# Patient Record
Sex: Female | Born: 1955 | Race: White | Hispanic: No | Marital: Married
Health system: Southern US, Community
[De-identification: ages and names within clinical notes are randomized; demographics above are authoritative.]

## PROBLEM LIST (undated history)

## (undated) DIAGNOSIS — M1990 Unspecified osteoarthritis, unspecified site: Secondary | ICD-10-CM

## (undated) DIAGNOSIS — T7840XA Allergy, unspecified, initial encounter: Secondary | ICD-10-CM

## (undated) DIAGNOSIS — F1021 Alcohol dependence, in remission: Secondary | ICD-10-CM

## (undated) DIAGNOSIS — J45909 Unspecified asthma, uncomplicated: Secondary | ICD-10-CM

## (undated) HISTORY — PX: ABDOMINAL HYSTERECTOMY: SHX81

## (undated) HISTORY — DX: Unspecified asthma, uncomplicated: J45.909

## (undated) HISTORY — DX: Allergy, unspecified, initial encounter: T78.40XA

## (undated) HISTORY — DX: Unspecified osteoarthritis, unspecified site: M19.90

## (undated) HISTORY — PX: EYE SURGERY: SHX253

## (undated) HISTORY — DX: Alcohol dependence, in remission: F10.21

---

## 1997-09-03 ENCOUNTER — Ambulatory Visit: Admission: RE | Admit: 1997-09-03 | Discharge: 1997-09-03 | Payer: Self-pay | Admitting: Obstetrics and Gynecology

## 1998-01-25 ENCOUNTER — Other Ambulatory Visit: Admission: RE | Admit: 1998-01-25 | Discharge: 1998-01-25 | Payer: Self-pay | Admitting: Obstetrics and Gynecology

## 1998-09-14 ENCOUNTER — Encounter: Payer: Self-pay | Admitting: Obstetrics and Gynecology

## 1998-09-14 ENCOUNTER — Ambulatory Visit (HOSPITAL_COMMUNITY): Admission: RE | Admit: 1998-09-14 | Discharge: 1998-09-14 | Payer: Self-pay | Admitting: Obstetrics and Gynecology

## 1999-01-28 ENCOUNTER — Other Ambulatory Visit: Admission: RE | Admit: 1999-01-28 | Discharge: 1999-01-28 | Payer: Self-pay | Admitting: Obstetrics and Gynecology

## 1999-09-17 ENCOUNTER — Ambulatory Visit (HOSPITAL_COMMUNITY): Admission: RE | Admit: 1999-09-17 | Discharge: 1999-09-17 | Payer: Self-pay | Admitting: Obstetrics and Gynecology

## 1999-09-17 ENCOUNTER — Encounter: Payer: Self-pay | Admitting: Obstetrics and Gynecology

## 2000-01-31 ENCOUNTER — Other Ambulatory Visit: Admission: RE | Admit: 2000-01-31 | Discharge: 2000-01-31 | Payer: Self-pay | Admitting: Obstetrics and Gynecology

## 2000-02-29 ENCOUNTER — Encounter (INDEPENDENT_AMBULATORY_CARE_PROVIDER_SITE_OTHER): Payer: Self-pay | Admitting: Specialist

## 2000-02-29 ENCOUNTER — Other Ambulatory Visit: Admission: RE | Admit: 2000-02-29 | Discharge: 2000-02-29 | Payer: Self-pay | Admitting: Obstetrics and Gynecology

## 2000-04-05 ENCOUNTER — Observation Stay (HOSPITAL_COMMUNITY): Admission: RE | Admit: 2000-04-05 | Discharge: 2000-04-06 | Payer: Self-pay | Admitting: Obstetrics and Gynecology

## 2000-04-05 ENCOUNTER — Encounter (INDEPENDENT_AMBULATORY_CARE_PROVIDER_SITE_OTHER): Payer: Self-pay

## 2000-09-21 ENCOUNTER — Ambulatory Visit (HOSPITAL_COMMUNITY): Admission: RE | Admit: 2000-09-21 | Discharge: 2000-09-21 | Payer: Self-pay | Admitting: Obstetrics and Gynecology

## 2000-09-21 ENCOUNTER — Encounter: Payer: Self-pay | Admitting: Obstetrics and Gynecology

## 2001-02-27 ENCOUNTER — Other Ambulatory Visit: Admission: RE | Admit: 2001-02-27 | Discharge: 2001-02-27 | Payer: Self-pay | Admitting: Obstetrics and Gynecology

## 2001-04-16 ENCOUNTER — Encounter: Payer: Self-pay | Admitting: Emergency Medicine

## 2001-04-16 ENCOUNTER — Encounter: Admission: RE | Admit: 2001-04-16 | Discharge: 2001-04-16 | Payer: Self-pay | Admitting: Emergency Medicine

## 2001-09-26 ENCOUNTER — Ambulatory Visit (HOSPITAL_COMMUNITY): Admission: RE | Admit: 2001-09-26 | Discharge: 2001-09-26 | Payer: Self-pay | Admitting: Obstetrics and Gynecology

## 2001-09-26 ENCOUNTER — Encounter: Payer: Self-pay | Admitting: Obstetrics and Gynecology

## 2002-06-03 ENCOUNTER — Encounter: Payer: Self-pay | Admitting: Obstetrics and Gynecology

## 2002-06-03 ENCOUNTER — Encounter: Admission: RE | Admit: 2002-06-03 | Discharge: 2002-06-03 | Payer: Self-pay | Admitting: Obstetrics and Gynecology

## 2003-03-11 ENCOUNTER — Other Ambulatory Visit: Admission: RE | Admit: 2003-03-11 | Discharge: 2003-03-11 | Payer: Self-pay | Admitting: Obstetrics and Gynecology

## 2003-06-16 ENCOUNTER — Ambulatory Visit (HOSPITAL_COMMUNITY): Admission: RE | Admit: 2003-06-16 | Discharge: 2003-06-16 | Payer: Self-pay | Admitting: Obstetrics and Gynecology

## 2004-06-18 ENCOUNTER — Encounter: Admission: RE | Admit: 2004-06-18 | Discharge: 2004-06-18 | Payer: Self-pay | Admitting: Obstetrics and Gynecology

## 2005-06-22 ENCOUNTER — Encounter: Admission: RE | Admit: 2005-06-22 | Discharge: 2005-06-22 | Payer: Self-pay | Admitting: Obstetrics and Gynecology

## 2006-07-04 ENCOUNTER — Ambulatory Visit (HOSPITAL_COMMUNITY): Admission: RE | Admit: 2006-07-04 | Discharge: 2006-07-04 | Payer: Self-pay | Admitting: Obstetrics and Gynecology

## 2007-07-06 ENCOUNTER — Ambulatory Visit (HOSPITAL_COMMUNITY): Admission: RE | Admit: 2007-07-06 | Discharge: 2007-07-06 | Payer: Self-pay | Admitting: Obstetrics and Gynecology

## 2008-04-15 DIAGNOSIS — H35419 Lattice degeneration of retina, unspecified eye: Secondary | ICD-10-CM | POA: Insufficient documentation

## 2008-07-08 ENCOUNTER — Ambulatory Visit (HOSPITAL_COMMUNITY): Admission: RE | Admit: 2008-07-08 | Discharge: 2008-07-08 | Payer: Self-pay | Admitting: Obstetrics and Gynecology

## 2009-07-09 ENCOUNTER — Ambulatory Visit (HOSPITAL_COMMUNITY): Admission: RE | Admit: 2009-07-09 | Discharge: 2009-07-09 | Payer: Self-pay | Admitting: Obstetrics and Gynecology

## 2009-12-21 DIAGNOSIS — J4520 Mild intermittent asthma, uncomplicated: Secondary | ICD-10-CM | POA: Insufficient documentation

## 2010-06-07 ENCOUNTER — Other Ambulatory Visit (HOSPITAL_COMMUNITY): Payer: Self-pay | Admitting: Obstetrics and Gynecology

## 2010-06-07 DIAGNOSIS — Z1231 Encounter for screening mammogram for malignant neoplasm of breast: Secondary | ICD-10-CM

## 2010-06-25 NOTE — Op Note (Signed)
[facility]  Patient:    Lisa Contreras, Lisa Contreras                 MRN: [id number] Proc. Date: 04/05/00 Adm. Date:  [id number] Disc. Date: [id number] Attending:  Morene Antu                           Operative Report  PREOPERATIVE DIAGNOSIS:       Fibroid uterus.  POSTOPERATIVE DIAGNOSIS:      Fibroid uterus.  PROCEDURE:                    Laparoscopic-assisted vaginal hysterectomy, left                               salpingo-oophorectomy.  SURGEON:                      Sherry A. Rosalio Macadamia, M.D., Marina Gravel, M.D.  ANESTHESIA:                   General.  INDICATIONS:                  This is a 55 year old, G3, p1-0-2-1 woman, who has had a known fibroid uterus for many years.  The fibroids have just been monitored with only slow increase in size of the fibroids.  However, this year at the patients routine exam in December, the patient complained of heavier menstrual flow, more menstrual cramps, and severe pelvic pressure.  Because of these symptoms, the patient requests surgical removal of her uterus. Ultrasound had been performed at that time, showing significant increased size of her fibroid.  Fibroid at this time was approximately 7-8 cm in size. Because of this, the patient is brought to the operating room for an LAVH. Ovaries will remain in place if at all possible.  FINDINGS:                     12 weeks size anteflexed uterus, normal fallopian tubes and ovaries, normal upper abdominal exploration.  DESCRIPTION OF PROCEDURE:     The patient was brought into the operating room and given general anesthesia.  She was placed in the dorsal lithotomy position.  Her abdomen and her vagina were washed with Betadine.  Foley catheter was placed in the bladder.  Under buttocks drape was placed. Speculum was placed within the vagina.  Anterior lip of the cervix was grasped with a single-tooth tenaculum.  Single-tooth Hulka tenaculum was placed  in the endocervical canal.  First tenaculum and speculum were removed.  Surgeons gown and gloves were changed.  The patient was fully draped in a sterile fashion.  Subumbilical area was infiltrated with 0.25% Marcaine.  Incision was made.  Veress needle was introduced into the peritoneal cavity.  Its placement was checked with saline. Approximately 3 liters of carbon dioxide was insufflated.  Veress needle was removed, and laparoscopic trocar was easily introduced into the peritoneal cavity.  Laparoscope was placed with positive identification of pelvic organs. Left mid abdominal incision was made after placing Marcaine, and trocar was placed.  Under direct visualization, the same procedure was performed on the right.  The pelvis was inspected.  Although the uterus seemed to be somewhat enlarged with this fibroid, it was felt that an LAVH could be performed.  The left ovary was draped across part  of the uterus and fibroid, whereas the right tube and ovary were completely free.  Using 5 mm Ligasure instrument, the round ligament was cauterized and then cut.  The tissues from the round ligament to the left fallopian tube were cauterized in a similar fashion and cut.  The uteroovarian ligament was attempted to be separated from the uterus. However, this was in such proximity to the uterus, that it was very difficult.  The anatomy is such that it could not be separated safely. Therefore, the decision was made to do a left salpingo-oophorectomy.  The left infundibulopelvic ligament was cauterized and cut in multiple bites.  The ureter had been identified well below this prior to any cautery or surgical procedure.  Once this was able to get below the ovary and fibroid, the tissues were cauterized to the edge of the fibroid just above the uterine arteries. Attention was then turned to the right side.  The right uteroovarian ligament was able to be cauterized in multiple burns and cut.  The right  round ligament was cauterized and cut in a similar fashion.  The right tube and ovary was able to drop down into the pelvis.  There were some adhesions of the right ovary to the pelvic sidewall, and these were ligated.  The right ureter had been well identified below the surgical plane prior to any cautery.  The cautery was continued right to the sidewall of the uterus, again above the uterine arteries.  At this point, attention was turned to the vaginal portion of the case.  The patients legs were elevated.  A weighted speculum was placed within the vagina.  The cervix was grasped with two Christella Hartigan tenaculums.  The cervix was infiltrated with 1% Xylocaine and with 1:200,000 epinephrine circumferentially.  The cervix was circumcised circumferentially.  The bladder was developed off of the cervix with blunt dissection.  The posterior cul-de-sac was identified and entered sharply.  Some of these tissues were then cauterized with a Ligasure.  The uterosacral ligaments were identified. They were clamped, cut, and suture ligated with 0 Vicryl ligatures.  The cardinal ligaments were clamped with Ligasure cautery x 2 and then cut.  This was performed on alternating sides.  The bladder peritoneum was entered above the cervix.  The cardinal ligaments were continued to be clamped, cauterized x 2 and cut.  It was felt that the tissues on the right side of the uterus had been completely severed.  The cervix and a portion of the uterus was dissected in the midline for removal.  Another large portion of fibroid tissue was removed with sharp dissection in the middle of the uterus.  The uterus was then able to be manipulated to be removed from the vagina.  There was a small remaining ridge of tissue between the cardinal ligament and the left side of the uterus.  This was cauterized with the Ligasure x 2 and cut.  The posterior vaginal cuff was then closed using #0 Vicryl in a running, lock stitch  for hemostasis.  There was some oozing along the pedicles of the cardinal ligament.  This was closed using the 0 Vicryl figure-of-eight sutures stitches.  There was some bleeding beneath the bladder.  This was lightly  cauterized.  The vaginal cuff was closed anteriorly by doing a running lock stitch with 0 Vicryl, incorporating the peritoneal tissue to the vaginal edge. Adequate hemostasis was then present with no further significant bleeding present.  The vaginal cuff was then closed using 0  Vicryl in mattress-type sutures, making a vertical closure.  Adequate hemostasis was present.  The surgeons gloves were changed.  The carbon dioxide was once again allowed to insufflate the peritoneal cavity, and the laparoscope was replaced in the abdominal cavity.  Pictures were obtained of all pedicles.  The pedicles were irrigated.  There as no active bleeding.  All irrigant and bloody fluid was suctioned.  Upper abdominal view was taken, and there were no significant abnormalities seen.  After most of the bloody fluid was able to be removed, the laparoscope was removed under direct visualization.  The trocars were removed after the left was removed under direct visualization.  The right trocar was removed after all carbon dioxide had escaped after the laparoscope had been removed.  The umbilical incision was closed with 0 Vicryl in a figure-of-eight stitch.  Incisions were washed, and adequate hemostasis was present.  Incisions were closed with Dermabond.  The patient was then taken out of the dorsal lithotomy position.  She was awakened.  She was extubated. She was moved from the operating table to a stretcher in stable condition. Complications were none.  Estimated blood loss was 250 cc. DD:  04/05/00 TD:  04/06/00 Job: [id number]

## 2010-07-12 ENCOUNTER — Ambulatory Visit (HOSPITAL_COMMUNITY)
Admission: RE | Admit: 2010-07-12 | Discharge: 2010-07-12 | Disposition: A | Discharge status: Home / Self Care | Payer: BC Managed Care – PPO | Source: Ambulatory Visit | Attending: Obstetrics and Gynecology | Admitting: Obstetrics and Gynecology

## 2010-07-12 DIAGNOSIS — Z1231 Encounter for screening mammogram for malignant neoplasm of breast: Secondary | ICD-10-CM | POA: Insufficient documentation

## 2011-05-03 DIAGNOSIS — J309 Allergic rhinitis, unspecified: Secondary | ICD-10-CM | POA: Insufficient documentation

## 2011-06-07 ENCOUNTER — Other Ambulatory Visit (HOSPITAL_COMMUNITY): Payer: Self-pay | Admitting: Obstetrics and Gynecology

## 2011-06-07 DIAGNOSIS — Z1231 Encounter for screening mammogram for malignant neoplasm of breast: Secondary | ICD-10-CM

## 2011-07-14 ENCOUNTER — Ambulatory Visit (HOSPITAL_COMMUNITY)
Admission: RE | Admit: 2011-07-14 | Discharge: 2011-07-14 | Disposition: A | Discharge status: Home / Self Care | Payer: PRIVATE HEALTH INSURANCE | Source: Ambulatory Visit | Attending: Obstetrics and Gynecology | Admitting: Obstetrics and Gynecology

## 2011-07-14 DIAGNOSIS — Z1231 Encounter for screening mammogram for malignant neoplasm of breast: Secondary | ICD-10-CM | POA: Insufficient documentation

## 2012-05-03 ENCOUNTER — Other Ambulatory Visit (HOSPITAL_COMMUNITY): Payer: Self-pay | Admitting: Family Medicine

## 2012-05-03 DIAGNOSIS — B3732 Chronic candidiasis of vulva and vagina: Secondary | ICD-10-CM | POA: Insufficient documentation

## 2012-05-03 DIAGNOSIS — Z1231 Encounter for screening mammogram for malignant neoplasm of breast: Secondary | ICD-10-CM

## 2012-07-16 ENCOUNTER — Ambulatory Visit (HOSPITAL_COMMUNITY)
Admission: RE | Admit: 2012-07-16 | Discharge: 2012-07-16 | Disposition: A | Discharge status: Home / Self Care | Payer: PRIVATE HEALTH INSURANCE | Source: Ambulatory Visit | Attending: Family Medicine | Admitting: Family Medicine

## 2012-07-16 DIAGNOSIS — Z1231 Encounter for screening mammogram for malignant neoplasm of breast: Secondary | ICD-10-CM

## 2013-05-06 DIAGNOSIS — N393 Stress incontinence (female) (male): Secondary | ICD-10-CM | POA: Insufficient documentation

## 2013-06-11 ENCOUNTER — Other Ambulatory Visit (HOSPITAL_COMMUNITY): Payer: Self-pay | Admitting: Family Medicine

## 2013-06-11 DIAGNOSIS — Z1231 Encounter for screening mammogram for malignant neoplasm of breast: Secondary | ICD-10-CM

## 2013-07-23 ENCOUNTER — Ambulatory Visit (HOSPITAL_COMMUNITY)
Admission: RE | Admit: 2013-07-23 | Discharge: 2013-07-23 | Disposition: A | Discharge status: Home / Self Care | Payer: Managed Care, Other (non HMO) | Source: Ambulatory Visit | Attending: Family Medicine | Admitting: Family Medicine

## 2013-07-23 DIAGNOSIS — Z1231 Encounter for screening mammogram for malignant neoplasm of breast: Secondary | ICD-10-CM | POA: Insufficient documentation

## 2013-08-20 ENCOUNTER — Ambulatory Visit (HOSPITAL_COMMUNITY): Payer: PRIVATE HEALTH INSURANCE

## 2014-06-16 ENCOUNTER — Other Ambulatory Visit (HOSPITAL_COMMUNITY): Payer: Self-pay | Admitting: Family Medicine

## 2014-06-16 DIAGNOSIS — Z1231 Encounter for screening mammogram for malignant neoplasm of breast: Secondary | ICD-10-CM

## 2014-07-25 ENCOUNTER — Ambulatory Visit (HOSPITAL_COMMUNITY)
Admission: RE | Admit: 2014-07-25 | Discharge: 2014-07-25 | Disposition: A | Discharge status: Home / Self Care | Payer: BLUE CROSS/BLUE SHIELD | Source: Ambulatory Visit | Attending: Family Medicine | Admitting: Family Medicine

## 2014-07-25 DIAGNOSIS — Z1231 Encounter for screening mammogram for malignant neoplasm of breast: Secondary | ICD-10-CM | POA: Diagnosis not present

## 2014-09-03 DIAGNOSIS — F1021 Alcohol dependence, in remission: Secondary | ICD-10-CM | POA: Insufficient documentation

## 2015-06-29 ENCOUNTER — Other Ambulatory Visit: Payer: Self-pay

## 2015-06-29 DIAGNOSIS — Z1231 Encounter for screening mammogram for malignant neoplasm of breast: Secondary | ICD-10-CM

## 2015-08-03 ENCOUNTER — Ambulatory Visit
Admission: RE | Admit: 2015-08-03 | Discharge: 2015-08-03 | Disposition: A | Discharge status: Home / Self Care | Payer: 59 | Source: Ambulatory Visit

## 2015-08-03 DIAGNOSIS — Z1231 Encounter for screening mammogram for malignant neoplasm of breast: Secondary | ICD-10-CM

## 2016-01-13 DIAGNOSIS — M79673 Pain in unspecified foot: Secondary | ICD-10-CM

## 2016-01-28 DIAGNOSIS — R52 Pain, unspecified: Secondary | ICD-10-CM

## 2016-03-02 DIAGNOSIS — L738 Other specified follicular disorders: Secondary | ICD-10-CM | POA: Diagnosis not present

## 2016-03-02 DIAGNOSIS — L821 Other seborrheic keratosis: Secondary | ICD-10-CM | POA: Diagnosis not present

## 2016-03-02 DIAGNOSIS — L720 Epidermal cyst: Secondary | ICD-10-CM | POA: Diagnosis not present

## 2016-05-17 DIAGNOSIS — Z1159 Encounter for screening for other viral diseases: Secondary | ICD-10-CM | POA: Diagnosis not present

## 2016-05-17 DIAGNOSIS — J4520 Mild intermittent asthma, uncomplicated: Secondary | ICD-10-CM | POA: Diagnosis not present

## 2016-05-17 DIAGNOSIS — Z0000 Encounter for general adult medical examination without abnormal findings: Secondary | ICD-10-CM | POA: Diagnosis not present

## 2016-05-17 LAB — HM HEPATITIS C SCREENING LAB: HM Hepatitis Screen: NEGATIVE

## 2016-06-24 ENCOUNTER — Other Ambulatory Visit: Payer: Self-pay | Admitting: Family Medicine

## 2016-06-24 DIAGNOSIS — Z1231 Encounter for screening mammogram for malignant neoplasm of breast: Secondary | ICD-10-CM

## 2016-08-04 ENCOUNTER — Ambulatory Visit
Admission: RE | Admit: 2016-08-04 | Discharge: 2016-08-04 | Disposition: A | Discharge status: Home / Self Care | Payer: 59 | Source: Ambulatory Visit | Attending: Family Medicine | Admitting: Family Medicine

## 2016-08-04 DIAGNOSIS — Z1231 Encounter for screening mammogram for malignant neoplasm of breast: Secondary | ICD-10-CM

## 2016-08-07 LAB — HM COLONOSCOPY

## 2017-02-21 DIAGNOSIS — M19042 Primary osteoarthritis, left hand: Secondary | ICD-10-CM | POA: Diagnosis not present

## 2017-02-21 DIAGNOSIS — N761 Subacute and chronic vaginitis: Secondary | ICD-10-CM | POA: Diagnosis not present

## 2017-02-21 DIAGNOSIS — J309 Allergic rhinitis, unspecified: Secondary | ICD-10-CM | POA: Diagnosis not present

## 2017-03-06 DIAGNOSIS — Z118 Encounter for screening for other infectious and parasitic diseases: Secondary | ICD-10-CM | POA: Diagnosis not present

## 2017-03-06 DIAGNOSIS — M19041 Primary osteoarthritis, right hand: Secondary | ICD-10-CM | POA: Diagnosis not present

## 2017-03-06 DIAGNOSIS — Z01419 Encounter for gynecological examination (general) (routine) without abnormal findings: Secondary | ICD-10-CM | POA: Diagnosis not present

## 2017-03-06 DIAGNOSIS — N761 Subacute and chronic vaginitis: Secondary | ICD-10-CM | POA: Diagnosis not present

## 2017-03-13 ENCOUNTER — Ambulatory Visit
Admission: RE | Admit: 2017-03-13 | Discharge: 2017-03-13 | Disposition: A | Discharge status: Home / Self Care | Payer: 59 | Source: Ambulatory Visit | Attending: Family Medicine | Admitting: Family Medicine

## 2017-03-13 ENCOUNTER — Other Ambulatory Visit: Payer: Self-pay | Admitting: Family Medicine

## 2017-03-13 DIAGNOSIS — R609 Edema, unspecified: Secondary | ICD-10-CM

## 2017-03-13 DIAGNOSIS — M19041 Primary osteoarthritis, right hand: Secondary | ICD-10-CM | POA: Diagnosis not present

## 2017-03-20 DIAGNOSIS — M19049 Primary osteoarthritis, unspecified hand: Secondary | ICD-10-CM | POA: Diagnosis not present

## 2017-03-20 DIAGNOSIS — N952 Postmenopausal atrophic vaginitis: Secondary | ICD-10-CM | POA: Diagnosis not present

## 2017-05-03 DIAGNOSIS — L720 Epidermal cyst: Secondary | ICD-10-CM | POA: Diagnosis not present

## 2017-05-03 DIAGNOSIS — L738 Other specified follicular disorders: Secondary | ICD-10-CM | POA: Diagnosis not present

## 2017-05-03 DIAGNOSIS — L820 Inflamed seborrheic keratosis: Secondary | ICD-10-CM | POA: Diagnosis not present

## 2017-05-03 DIAGNOSIS — L821 Other seborrheic keratosis: Secondary | ICD-10-CM | POA: Diagnosis not present

## 2017-05-17 DIAGNOSIS — Z0000 Encounter for general adult medical examination without abnormal findings: Secondary | ICD-10-CM | POA: Diagnosis not present

## 2017-05-22 DIAGNOSIS — E875 Hyperkalemia: Secondary | ICD-10-CM | POA: Diagnosis not present

## 2017-05-22 DIAGNOSIS — N952 Postmenopausal atrophic vaginitis: Secondary | ICD-10-CM | POA: Diagnosis not present

## 2017-05-22 DIAGNOSIS — Z0000 Encounter for general adult medical examination without abnormal findings: Secondary | ICD-10-CM | POA: Diagnosis not present

## 2017-05-22 DIAGNOSIS — Z1211 Encounter for screening for malignant neoplasm of colon: Secondary | ICD-10-CM | POA: Diagnosis not present

## 2017-06-12 DIAGNOSIS — E875 Hyperkalemia: Secondary | ICD-10-CM | POA: Diagnosis not present

## 2017-06-15 DIAGNOSIS — E875 Hyperkalemia: Secondary | ICD-10-CM | POA: Diagnosis not present

## 2017-06-26 ENCOUNTER — Other Ambulatory Visit: Payer: Self-pay | Admitting: Family Medicine

## 2017-06-26 DIAGNOSIS — Z1231 Encounter for screening mammogram for malignant neoplasm of breast: Secondary | ICD-10-CM

## 2017-08-07 ENCOUNTER — Ambulatory Visit
Admission: RE | Admit: 2017-08-07 | Discharge: 2017-08-07 | Disposition: A | Discharge status: Home / Self Care | Payer: 59 | Source: Ambulatory Visit | Attending: Family Medicine | Admitting: Family Medicine

## 2017-08-07 DIAGNOSIS — Z1231 Encounter for screening mammogram for malignant neoplasm of breast: Secondary | ICD-10-CM | POA: Diagnosis not present

## 2017-09-07 DIAGNOSIS — R0989 Other specified symptoms and signs involving the circulatory and respiratory systems: Secondary | ICD-10-CM | POA: Diagnosis not present

## 2017-09-07 DIAGNOSIS — Z711 Person with feared health complaint in whom no diagnosis is made: Secondary | ICD-10-CM | POA: Diagnosis not present

## 2017-09-14 DIAGNOSIS — Z711 Person with feared health complaint in whom no diagnosis is made: Secondary | ICD-10-CM | POA: Diagnosis not present

## 2017-09-25 DIAGNOSIS — Z23 Encounter for immunization: Secondary | ICD-10-CM | POA: Diagnosis not present

## 2017-11-09 DIAGNOSIS — E875 Hyperkalemia: Secondary | ICD-10-CM | POA: Diagnosis not present

## 2017-11-14 DIAGNOSIS — E875 Hyperkalemia: Secondary | ICD-10-CM | POA: Diagnosis not present

## 2018-01-09 DIAGNOSIS — Z6826 Body mass index (BMI) 26.0-26.9, adult: Secondary | ICD-10-CM | POA: Diagnosis not present

## 2018-01-09 DIAGNOSIS — J069 Acute upper respiratory infection, unspecified: Secondary | ICD-10-CM | POA: Diagnosis not present

## 2018-05-29 DIAGNOSIS — J45909 Unspecified asthma, uncomplicated: Secondary | ICD-10-CM | POA: Diagnosis not present

## 2018-05-29 DIAGNOSIS — N952 Postmenopausal atrophic vaginitis: Secondary | ICD-10-CM | POA: Diagnosis not present

## 2018-05-29 DIAGNOSIS — J309 Allergic rhinitis, unspecified: Secondary | ICD-10-CM | POA: Diagnosis not present

## 2018-06-12 DIAGNOSIS — M79676 Pain in unspecified toe(s): Secondary | ICD-10-CM

## 2018-06-12 DIAGNOSIS — D1801 Hemangioma of skin and subcutaneous tissue: Secondary | ICD-10-CM | POA: Diagnosis not present

## 2018-06-12 DIAGNOSIS — L820 Inflamed seborrheic keratosis: Secondary | ICD-10-CM | POA: Diagnosis not present

## 2018-06-12 DIAGNOSIS — L720 Epidermal cyst: Secondary | ICD-10-CM | POA: Diagnosis not present

## 2018-06-29 ENCOUNTER — Other Ambulatory Visit: Payer: Self-pay | Admitting: Family Medicine

## 2018-06-29 DIAGNOSIS — Z1231 Encounter for screening mammogram for malignant neoplasm of breast: Secondary | ICD-10-CM

## 2018-08-17 ENCOUNTER — Ambulatory Visit
Admission: RE | Admit: 2018-08-17 | Discharge: 2018-08-17 | Disposition: A | Discharge status: Home / Self Care | Payer: 59 | Source: Ambulatory Visit | Attending: Family Medicine | Admitting: Family Medicine

## 2018-08-17 ENCOUNTER — Other Ambulatory Visit: Payer: Self-pay

## 2018-08-17 DIAGNOSIS — Z1231 Encounter for screening mammogram for malignant neoplasm of breast: Secondary | ICD-10-CM

## 2018-09-25 ENCOUNTER — Other Ambulatory Visit: Payer: Self-pay

## 2018-09-25 DIAGNOSIS — Z20822 Contact with and (suspected) exposure to covid-19: Secondary | ICD-10-CM

## 2018-09-26 LAB — NOVEL CORONAVIRUS, NAA: SARS-CoV-2, NAA: NOT DETECTED

## 2018-12-20 IMAGING — DX DG FINGER RING 2+V*R*
3 series · 3 of 3 positions shown · non-contrast
Comparison: None.

CLINICAL DATA: Right ring finger pain and swelling centered about
the PIP joint for 1 month. No known injury.

EXAM:
RIGHT RING FINGER 2+V

[dg finger ring right (1 of 3)]
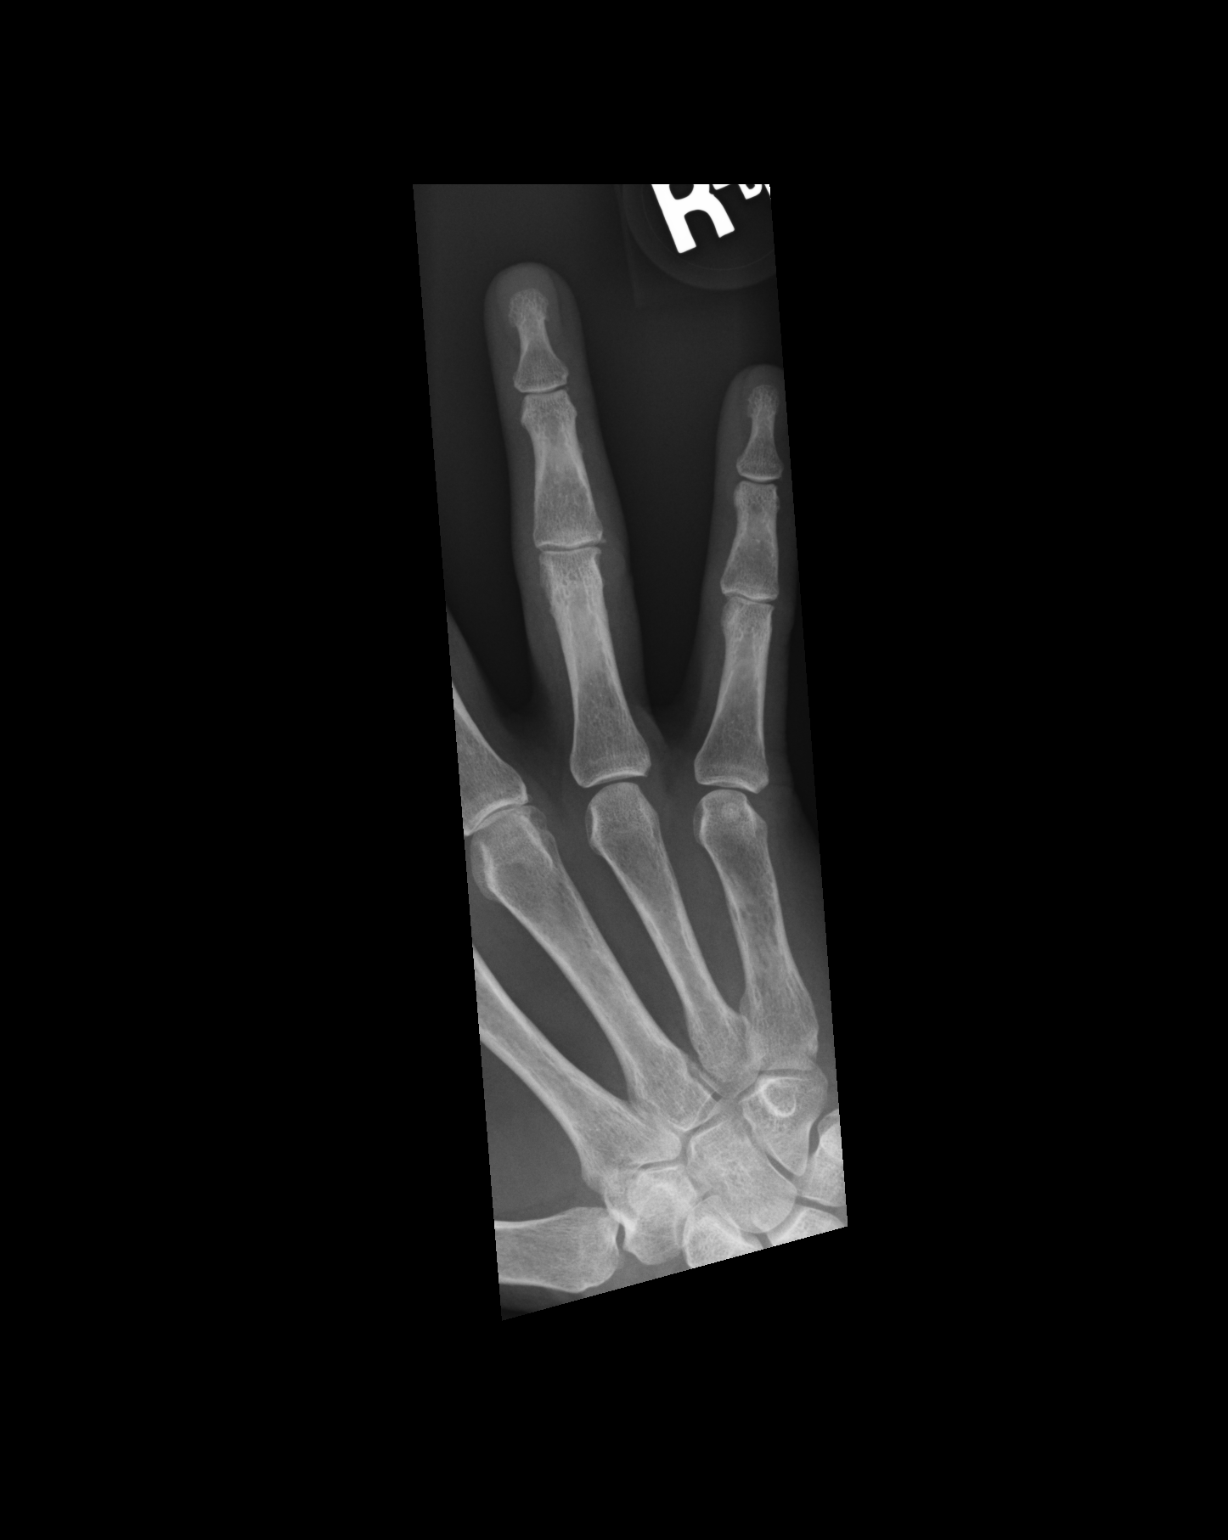

[dg finger ring right (2 of 3)]
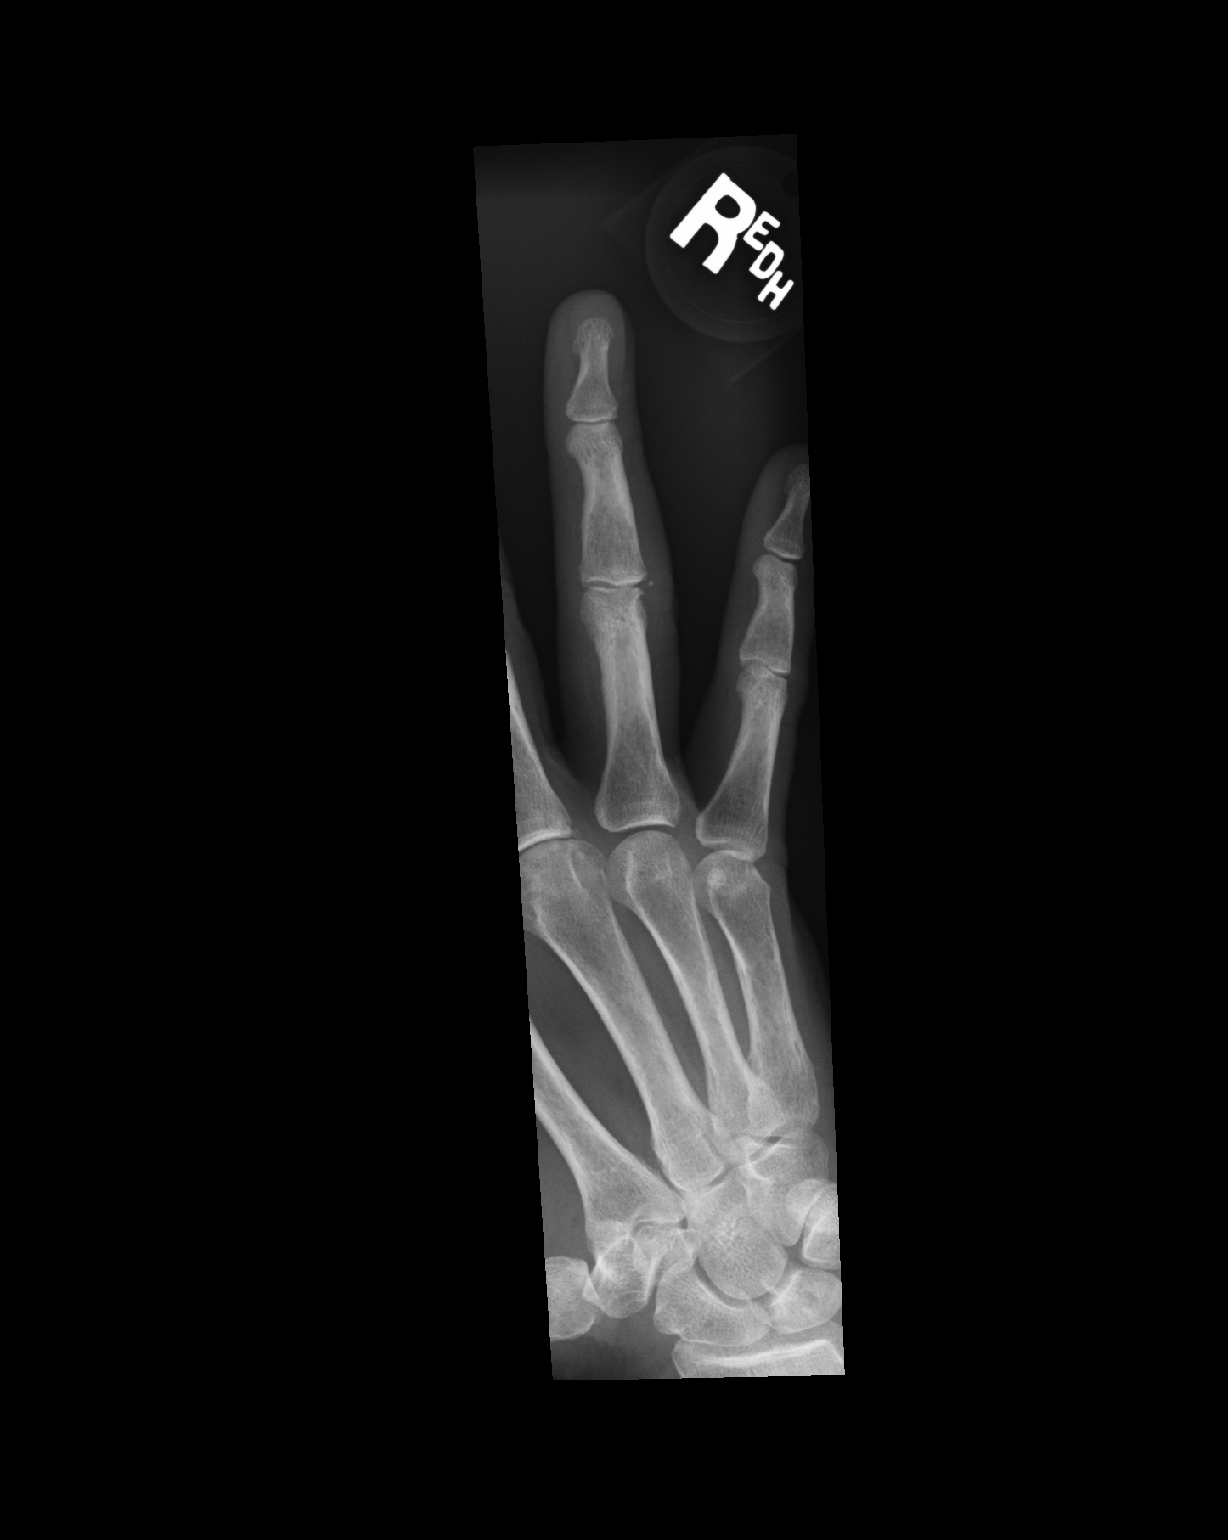

[dg finger ring right (3 of 3)]
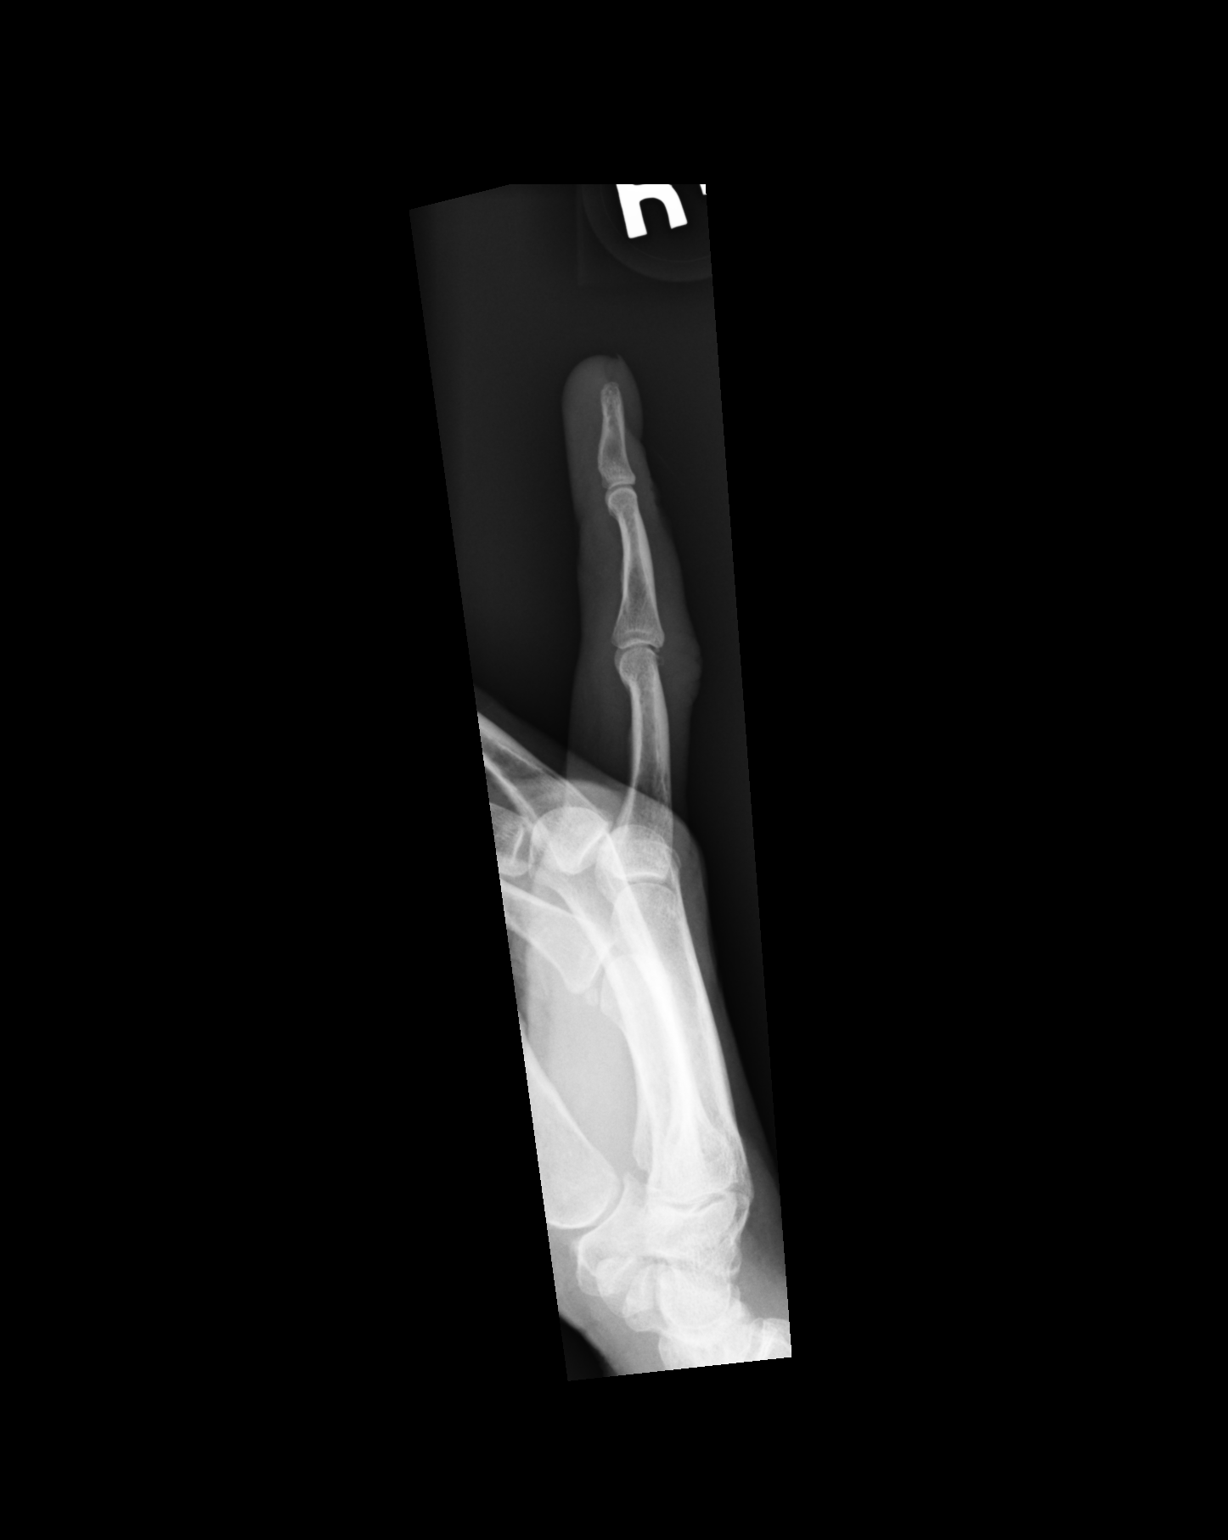

[3 of 3 positions shown; findings below may reference images not displayed]

FINDINGS: No acute bony or joint abnormality is identified. There is some
osteophytosis about the PIP joint of the ring finger. Soft tissues
about the PIP joint appear swollen. No soft tissue gas or radiopaque
foreign body.
IMPRESSION: Soft tissue swelling PIP joint right ring finger without underlying
acute bony or joint abnormality.

Mild to moderate osteoarthritis PIP joint right ring finger.

## 2019-07-12 ENCOUNTER — Other Ambulatory Visit: Payer: Self-pay | Admitting: Family Medicine

## 2019-07-12 DIAGNOSIS — Z1231 Encounter for screening mammogram for malignant neoplasm of breast: Secondary | ICD-10-CM

## 2019-08-19 ENCOUNTER — Other Ambulatory Visit: Payer: Self-pay

## 2019-08-19 ENCOUNTER — Ambulatory Visit
Admission: RE | Admit: 2019-08-19 | Discharge: 2019-08-19 | Disposition: A | Discharge status: Home / Self Care | Payer: Medicare Other | Source: Ambulatory Visit | Attending: Family Medicine | Admitting: Family Medicine

## 2019-08-19 DIAGNOSIS — Z1231 Encounter for screening mammogram for malignant neoplasm of breast: Secondary | ICD-10-CM

## 2020-01-06 ENCOUNTER — Ambulatory Visit (INDEPENDENT_AMBULATORY_CARE_PROVIDER_SITE_OTHER): Payer: Medicare Other | Admitting: Podiatrist

## 2020-01-06 ENCOUNTER — Encounter: Payer: Self-pay | Admitting: Podiatrist

## 2020-01-06 ENCOUNTER — Other Ambulatory Visit: Payer: Self-pay

## 2020-01-06 DIAGNOSIS — L84 Corns and callosities: Secondary | ICD-10-CM

## 2020-01-06 DIAGNOSIS — M722 Plantar fascial fibromatosis: Secondary | ICD-10-CM

## 2020-01-06 DIAGNOSIS — M2042 Other hammer toe(s) (acquired), left foot: Secondary | ICD-10-CM | POA: Diagnosis not present

## 2020-01-06 NOTE — Progress Notes (Signed)
  Chief Complaint  Patient presents with  . Callouses    PT STATES SHE HAS A PRESENT CALLUS   . Foot Orthotics    PT STATES SHE WANTS HER ORTHOTICS LOOKED AT FOR FURTHER EVALUATION      HPI: Patient is 64 y.o. female who presents today for the concerns as listed above. She relates she used to see Dr. Amalia Hailey for plantar fasciitis and has orthotics from that time period.  She would like to know if she needs new ones or just a refurbishment of the current orthotics.  She also has a callus on the outside of the left fourth toe that has become bothersome.  She has tried no treatment for the callus.   There are no problems to display for this patient.   No current outpatient medications on file prior to visit.   No current facility-administered medications on file prior to visit.    No Known Allergies  Review of Systems No fevers, chills, nausea, muscle aches, no difficulty breathing, no calf pain, no chest pain or shortness of breath.   Physical Exam  GENERAL APPEARANCE: Alert, conversant. Appropriately groomed. No acute distress.   VASCULAR: Pedal pulses palpable DP and PT bilateral.  Capillary refill time is immediate to all digits,  Proximal to distal cooling it warm to warm.  Digital perfusion adequate.   NEUROLOGIC: sensation is intact to 5.07 monofilament at 5/5 sites bilateral.  Light touch is intact bilateral, vibratory sensation intact bilateral  MUSCULOSKELETAL: acceptable muscle strength, tone and stability bilateral.  No gross boney pedal deformities noted. 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   DERMATOLOGIC: skin is warm, supple, and dry.  No open lesions noted.  No rash, small callus left fourth toe laterally. Digital nails are asymptomatic.      Assessment     ICD-10-CM   1. Hammertoe of left foot  M20.42   2. Corn or callus  L84   3. Plantar fasciitis, bilateral  M72.2       Plan  Examination findings discussed with the patient.  I recommended paring down the callus on the left fourth toe and the patient agreed I  pared the callus down with a #15 blade without complication and offered a silicone pad.  I recommended lambswool to try as well. I  also evaluated her orthotics and recommended a new pair.  Liliane Channel was able to get her scanned at today's visit and he will call her when these are ready for pickup.  She will then be seen back as needed for follow-up if any concerns arise she will call.

## 2020-02-13 ENCOUNTER — Other Ambulatory Visit: Payer: Medicare Other | Admitting: Orthotics

## 2020-02-13 ENCOUNTER — Other Ambulatory Visit: Payer: Self-pay

## 2020-02-17 ENCOUNTER — Other Ambulatory Visit: Payer: Medicare Other | Admitting: Orthotics

## 2020-03-16 ENCOUNTER — Telehealth: Payer: Self-pay | Admitting: Podiatrist

## 2020-03-16 NOTE — Telephone Encounter (Signed)
Pt left message this morning asking about status of orthotics.  Upon checking with Liliane Channel they should be done today and I told pt I will have him bring them to me when done and I will call to let her know.Marland Kitchen

## 2020-04-01 ENCOUNTER — Encounter: Payer: Self-pay | Admitting: Podiatrist

## 2020-04-01 ENCOUNTER — Other Ambulatory Visit: Payer: Self-pay

## 2020-04-01 ENCOUNTER — Ambulatory Visit (INDEPENDENT_AMBULATORY_CARE_PROVIDER_SITE_OTHER): Payer: Medicare Other | Admitting: Podiatrist

## 2020-04-01 DIAGNOSIS — M722 Plantar fascial fibromatosis: Secondary | ICD-10-CM

## 2020-04-01 MED ORDER — TRIAMCINOLONE ACETONIDE 10 MG/ML IJ SUSP
10.0000 mg | Freq: Once | INTRAMUSCULAR | Status: AC
  Administered 2020-04-01: 10 mg

## 2020-04-01 NOTE — Patient Instructions (Signed)

## 2020-04-01 NOTE — Progress Notes (Signed)
Chief Complaint  Patient presents with  . Plantar Fasciitis    F/U BL PF -pt picked up new inserts today -pain comes and goes; 10/10 at LT and 3/10 at Rt (Lt>Rt) -w/ swelling Tx: splints, icing, stretching      HPI: Patient is 65 y.o. female who presents today for pain on bilateral heels consistent with plantar fasciitis.  She has had plantar fasciitis for several years which have been under control with the use of orthotics.  Recently the pain has returned and she would like an injection.  She used to see Dr. Amalia Hailey for plantar fasciitis in the past.     Allergies  Allergen Reactions  . Aspirin Anaphylaxis, Hives, Itching, Shortness Of Breath and Other (See Comments)  . Elemental Sulfur Anaphylaxis, Hives, Itching, Rash, Shortness Of Breath and Swelling  . Penicillins Anaphylaxis, Hives, Itching, Nausea And Vomiting, Rash, Shortness Of Breath and Other (See Comments)  . Sulfa Antibiotics Anaphylaxis  . Tinidazole Other (See Comments)  . Chocolate Other (See Comments)  . Codeine Itching and Other (See Comments)  . Other Other (See Comments)  . Vitamin C Other (See Comments)  . Pseudoephedrine-Codeine-Gg Hives, Itching and Palpitations    Review of systems is reviewed and negative.   Physical Exam   GENERAL APPEARANCE: Alert, conversant. Appropriately groomed. No acute distress.   VASCULAR: Pedal pulses palpable and strong bilateral.  Capillary refill time is immediate to all digits,  Proximal to distal cooling it warm to warm.  Digital hair growth is present bilateral   NEUROLOGIC: sensation is intact epicritically and protectively to 5.07 monofilament at 5/5 sites bilateral.  Light touch is intact bilateral, vibratory sensation intact bilateral, achilles tendon reflex is intact bilateral.  Negative Tinel sign is elicited  MUSCULOSKELETAL: Pain on palpation plantar medial aspect both feet  at insertion of plantar fascia on the medial calcaneal tubercle. Inflammation at the insertion  of the plantar fascia is present. Rectus foot type is seen.  DERMATOLOGIC: skin color, texture, and turger are within normal limits.  No preulcerative lesions are seen, no interdigital maceration noted.  No open lesions present.  Digital nails are asymptomatic.    Assessment:   ICD-10-CM   1. Plantar fasciitis, bilateral  M72.2      Plan: Discussed treatment options and at this time a plantar fascial injection was recommended.  The patient agreed and a sterile skin prep was applied.  An injection consisting of kenalog and marcaine mixture was infiltrated at the point of maximal tenderness on the bilateral Heel.  The patient tolerated this well and was given instructions for aftercare.

## 2020-04-10 ENCOUNTER — Other Ambulatory Visit: Payer: Self-pay

## 2020-04-10 ENCOUNTER — Encounter: Payer: Self-pay | Admitting: Podiatry

## 2020-04-10 ENCOUNTER — Ambulatory Visit (INDEPENDENT_AMBULATORY_CARE_PROVIDER_SITE_OTHER): Payer: Medicare Other | Admitting: Podiatry

## 2020-04-10 DIAGNOSIS — M722 Plantar fascial fibromatosis: Secondary | ICD-10-CM

## 2020-04-10 NOTE — Patient Instructions (Signed)

## 2020-04-13 ENCOUNTER — Telehealth: Payer: Self-pay | Admitting: Podiatry

## 2020-04-13 NOTE — Progress Notes (Signed)
Subjective:   Patient ID: Lisa Contreras, female   DOB: 65 y.o.   MRN: [id number]   HPI Patient states her right heel is doing a lot better but the left heel has remained sore and states that she feels like she needs more support during the day   ROS      Objective:  Physical Exam  Neuro vascular status intact with discomfort of the left plantar fascia at the insertional point of the tendon into the calcaneus with inflammation fluid buildup and moderate depression of the arch noted     Assessment:  Acute plantar fasciitis left with moderate depression of the arch noted     Plan:  H&P reviewed condition sterile prep injected the fascia 3 mg Kenalog 5 mg Xylocaine applied fascial brace to lift up the arch gave instructions for physical therapy anti-inflammatory shoe gear support

## 2020-04-13 NOTE — Telephone Encounter (Signed)
Patient called and stated she has some questions about the PF brace. Please give her a call.

## 2020-06-30 ENCOUNTER — Other Ambulatory Visit: Payer: Self-pay | Admitting: Family Medicine

## 2020-06-30 DIAGNOSIS — Z1231 Encounter for screening mammogram for malignant neoplasm of breast: Secondary | ICD-10-CM

## 2020-08-20 ENCOUNTER — Other Ambulatory Visit: Payer: Self-pay | Admitting: Family Medicine

## 2020-08-20 DIAGNOSIS — E2839 Other primary ovarian failure: Secondary | ICD-10-CM

## 2020-08-28 ENCOUNTER — Other Ambulatory Visit: Payer: Self-pay

## 2020-08-28 ENCOUNTER — Ambulatory Visit
Admission: RE | Admit: 2020-08-28 | Discharge: 2020-08-28 | Disposition: A | Discharge status: Home / Self Care | Payer: Medicare Other | Source: Ambulatory Visit | Attending: Family Medicine | Admitting: Family Medicine

## 2020-08-28 DIAGNOSIS — Z1231 Encounter for screening mammogram for malignant neoplasm of breast: Secondary | ICD-10-CM

## 2020-10-27 ENCOUNTER — Encounter: Payer: Self-pay | Admitting: Podiatrist

## 2020-10-27 ENCOUNTER — Other Ambulatory Visit: Payer: Self-pay

## 2020-10-27 ENCOUNTER — Ambulatory Visit (INDEPENDENT_AMBULATORY_CARE_PROVIDER_SITE_OTHER): Payer: Medicare Other | Admitting: Podiatrist

## 2020-10-27 DIAGNOSIS — M722 Plantar fascial fibromatosis: Secondary | ICD-10-CM | POA: Diagnosis not present

## 2020-10-27 DIAGNOSIS — M7660 Achilles tendinitis, unspecified leg: Secondary | ICD-10-CM | POA: Diagnosis not present

## 2020-10-27 NOTE — Progress Notes (Signed)
Chief Complaint  Patient presents with   Foot Orthotics    Pt is requesting a new pair of orthotics at no charge due to current pair falling apart- received them in Feb 2022-   Plantar Fasciitis    Right foot flare up      HPI: Patient is 65 y.o. female who presents today for follow-up of heel pain.  And to discuss her orthotics that she just received in February 2022.  She relates the posterior aspect of the right heel is painful and not improving with the use of the orthotics.  She states she had a flareup of the left heel of which she received an injection and that foot feels okay now.   Allergies  Allergen Reactions   Aspirin Anaphylaxis, Hives, Itching, Shortness Of Breath and Other (See Comments)   Elemental Sulfur Anaphylaxis, Hives, Itching, Rash, Shortness Of Breath and Swelling   Penicillins Anaphylaxis, Hives, Itching, Nausea And Vomiting, Rash, Shortness Of Breath and Other (See Comments)   Sulfa Antibiotics Anaphylaxis   Tinidazole Other (See Comments)   Chocolate Other (See Comments)   Codeine Itching and Other (See Comments)   Other Other (See Comments)   Vitamin C Other (See Comments)   Pseudoephedrine-Codeine-Gg Hives, Itching and Palpitations    Review of systems is reviewed and negative.   Physical Exam  Patient is awake, alert, and oriented x 3.  In no acute distress.    Vascular status is intact with palpable pedal pulses DP and PT bilateral and capillary refill time less than 3 seconds bilateral.  No edema or erythema noted.   Neurological exam reveals epicritic and protective sensation grossly intact bilateral.   Dermatological exam reveals skin is supple and dry to bilateral feet.  No open lesions present.    Musculoskeletal exam: Musculature intact with dorsiflexion, plantarflexion, inversion, eversion. Ankle range of motion is decreased bilateral.  Pain on the posterior aspect of the right heel at the insertion of the Achilles is noted.  Orthotics are  evaluated and the top-cover does appear to loosen from the supporting surface.  It also appears she needs a higher arch fill in comparison to the original orthotics that she received in [place] which have a higher arch to support her plantar fascial.  Assessment: 1. Insertional Achilles tendinopathy   2. Plantar fasciitis, bilateral      Plan: Discussed exam findings with the patient.  At this time because of the location of the pain in her right heel at the Achilles I recommended outpatient physical therapy and a referral is being made for her.  She would also benefit from bilateral physical therapy to try and prevent plantar fasciitis from returning.   I am also going to set her up to see our orthotic specialist, EJ to redo her orthotics.  She has an old pair that she likes with an arch fill as well as a neoprene top-cover and would like a similar device to this.  I recommended she bring this old device in so that he may prescribe is close to a similar device to this as he can.

## 2020-10-27 NOTE — Patient Instructions (Addendum)
I will send you to a physical therapist on [facility] for your heel pain bilateral-  their office will call you to confirm insurance benefits and to schedule your appointment and give you directions to their office.  If you have not heard from them by Friday, give Lisa Contreras a call.  Plantar Fasciitis (Heel Spur Syndrome) with Rehab The plantar fascia is a fibrous, ligament-like, soft-tissue structure that spans the bottom of the foot. Plantar fasciitis is a condition that causes pain in the foot due to inflammation of the tissue. SYMPTOMS  Pain and tenderness on the underneath side of the foot. Pain that worsens with standing or walking. CAUSES  Plantar fasciitis is caused by irritation and injury to the plantar fascia on the underneath side of the foot. Common mechanisms of injury include: Direct trauma to bottom of the foot. Damage to a small nerve that runs under the foot where the main fascia attaches to the heel bone. Stress placed on the plantar fascia due to any mild increased activity or injury RISK INCREASES WITH:  Obesity. Poor strength and flexibility. Improperly fitted shoes. Tight calf muscles. Flat feet. Failure to warm-up properly before activity.  PREVENTION Warm up and stretch properly before activity. Strength, flexibility Maintain a health body weight. Avoid stress on the plantar fascia. Wear properly fitted shoes, including arch supports for individuals who have flat feet. PROGNOSIS  If treated properly, then the symptoms of plantar fasciitis usually resolve without surgery. However, occasionally surgery is necessary. RELATED COMPLICATIONS  Recurrent symptoms that may result in a chronic condition. Problems of the lower back that are caused by compensating for the injury, such as limping. Pain or weakness of the foot during push-off following surgery. Chronic inflammation, scarring, and partial or complete fascia tear, occurring more often from repeated  injections. TREATMENT  Treatment initially involves the use of ice and medication to help reduce pain and inflammation. The use of strengthening and stretching exercises may help reduce pain with activity, especially stretches of the Achilles tendon.  Your caregiver may recommend that you use arch supports to help reduce stress on the plantar fascia. Often, corticosteroid injections are given to reduce inflammation. If symptoms persist for greater than 6 months despite non-surgical (conservative), then surgery may be recommended.  MEDICATION  If pain medication is necessary, then nonsteroidal anti-inflammatory medications, such as aspirin and ibuprofen, or other minor pain relievers, such as acetaminophen, are often recommended. Corticosteroid injections may be given by your caregiver.  HEAT AND COLD Cold treatment (icing) relieves pain and reduces inflammation. Cold treatment should be applied for 10 to 15 minutes every 2 to 3 hours for inflammation and pain and immediately after any activity that aggravates your symptoms. Use ice packs or massage the area with a piece of ice (ice massage). Heat treatment may be used prior to performing the stretching and strengthening activities prescribed by your caregiver, physical therapist, or athletic trainer. Use a heat pack or soak the injury in warm water. SEEK IMMEDIATE MEDICAL CARE IF: Treatment seems to offer no benefit, or the condition worsens. Any medications produce adverse side effects.  Perform this particular stretch daily first thing in the morning and before you go to bed. Hold for 30 seconds.    Try all the exercises and choose your favorite 3 to perform daily--  EXERCISES-- perform each exercise a total of 10-15 repetitions.  Hold for 30 seconds and perform 3 times per day   RANGE OF MOTION (ROM) AND STRETCHING EXERCISES - Plantar  Fasciitis (Heel Spur Syndrome) These exercises may help you when beginning to rehabilitate your injury.    While completing these exercises, remember:  Restoring tissue flexibility helps normal motion to return to the joints. This allows healthier, less painful movement and activity. An effective stretch should be held for at least 30 seconds. A stretch should never be painful. You should only feel a gentle lengthening or release in the stretched tissue. RANGE OF MOTION - Toe Extension, Flexion Sit with your right / left leg crossed over your opposite knee. Grasp your toes and gently pull them back toward the top of your foot. You should feel a stretch on the bottom of your toes and/or foot. Hold this stretch for __________ seconds. Now, gently pull your toes toward the bottom of your foot. You should feel a stretch on the top of your toes and or foot. Hold this stretch for __________ seconds. Repeat __________ times. Complete this stretch __________ times per day.  RANGE OF MOTION - Ankle Dorsiflexion, Active Assisted Remove shoes and sit on a chair that is preferably not on a carpeted surface. Place right / left foot under knee. Extend your opposite leg for support. Keeping your heel down, slide your right / left foot back toward the chair until you feel a stretch at your ankle or calf. If you do not feel a stretch, slide your bottom forward to the edge of the chair, while still keeping your heel down. Hold this stretch for __________ seconds. Repeat __________ times. Complete this stretch __________ times per day.  STRETCH  Gastroc, Standing Place hands on wall. Extend right / left leg, keeping the front knee somewhat bent. Slightly point your toes inward on your back foot. Keeping your right / left heel on the floor and your knee straight, shift your weight toward the wall, not allowing your back to arch. You should feel a gentle stretch in the right / left calf. Hold this position for __________ seconds. Repeat __________ times. Complete this stretch __________ times per day. STRETCH  Soleus,  Standing Place hands on wall. Extend right / left leg, keeping the other knee somewhat bent. Slightly point your toes inward on your back foot. Keep your right / left heel on the floor, bend your back knee, and slightly shift your weight over the back leg so that you feel a gentle stretch deep in your back calf. Hold this position for __________ seconds. Repeat __________ times. Complete this stretch __________ times per day. STRETCH  Gastrocsoleus, Standing  Note: This exercise can place a lot of stress on your foot and ankle. Please complete this exercise only if specifically instructed by your caregiver.  Place the ball of your right / left foot on a step, keeping your other foot firmly on the same step. Hold on to the wall or a rail for balance. Slowly lift your other foot, allowing your body weight to press your heel down over the edge of the step. You should feel a stretch in your right / left calf. Hold this position for __________ seconds. Repeat this exercise with a slight bend in your right / left knee. Repeat __________ times. Complete this stretch __________ times per day.  STRENGTHENING EXERCISES - Plantar Fasciitis (Heel Spur Syndrome)  These exercises may help you when beginning to rehabilitate your injury. They may resolve your symptoms with or without further involvement from your physician, physical therapist or athletic trainer. While completing these exercises, remember:  Muscles can gain both the endurance  and the strength needed for everyday activities through controlled exercises. Complete these exercises as instructed by your physician, physical therapist or athletic trainer. Progress the resistance and repetitions only as guided.

## 2020-11-06 ENCOUNTER — Ambulatory Visit: Payer: Medicare Other | Admitting: Physical Therapy

## 2020-11-17 ENCOUNTER — Ambulatory Visit: Payer: Medicare Other | Admitting: Physical Therapy

## 2020-11-24 ENCOUNTER — Telehealth: Payer: Self-pay | Admitting: Podiatrist

## 2020-11-24 NOTE — Telephone Encounter (Signed)
Left message for pt to call to reschedule her appt for 10.28 as EJ is going to be out of the office. His next available will be 11.11.2022.Marland Kitchen

## 2020-11-24 NOTE — Telephone Encounter (Signed)
Pt left message upset that we were changing her appt on 10.28.2022 with Ej. She stated the orthotics she have never worked for her and she has already had to wait about 6 weeks to get in with Ej. I left message for pt that Ej's next available would be 11..11.2022 but pt cannot make that appt and called and was scheduled for 12.2 to see EJ. She wanted to discuss how she should not have to wait longer for the orthotics that she paid 438.00 for to be corrected.   I called pt and she stated the new orthotics she got in January are falling apart and she has spoken to Dr Valentina Lucks about it. I told her I had a note from Dr Valentina Lucks that there was no charge for the new orthotics. I did explain that we have not had a pedorthist for the last 7 months and EJ is only here 1 or 2 times a month and that is why it is delayed. But we do have a new pedorthist starting mid to late November and if I can get her in sooner I will call her. She said thank you for calling her back.

## 2020-11-26 ENCOUNTER — Ambulatory Visit: Payer: Medicare Other | Admitting: Podiatrist

## 2020-11-27 ENCOUNTER — Other Ambulatory Visit: Payer: Self-pay

## 2020-11-27 ENCOUNTER — Ambulatory Visit: Payer: Medicare Other | Attending: Podiatrist

## 2020-11-27 DIAGNOSIS — M722 Plantar fascial fibromatosis: Secondary | ICD-10-CM | POA: Diagnosis present

## 2020-11-27 DIAGNOSIS — M6281 Muscle weakness (generalized): Secondary | ICD-10-CM | POA: Insufficient documentation

## 2020-11-27 DIAGNOSIS — M7660 Achilles tendinitis, unspecified leg: Secondary | ICD-10-CM | POA: Diagnosis not present

## 2020-11-28 NOTE — Therapy (Signed)
[place], [place], [address] Phone: [phone]   Fax:  [phone]  Physical Therapy Evaluation  Patient Details  Name: Lisa Contreras MRN: [id number] Date of Birth: 08/26/1955 Referring Provider (PT): Bronson Ing, [place]   Encounter Date: 11/27/2020   PT End of Session - 11/28/20 1940     Visit Number 1    Number of Visits 9    Date for PT Re-Evaluation 01/30/21    Authorization Type MEDICARE PART A AND B, [employer] SENIOR [employer]PPLEMENTAL INSURANCE    PT Start Time 0803    PT Stop Time [phone]    PT Time Calculation (min) 48 min    Activity Tolerance Patient tolerated treatment well    Behavior During Therapy [facility] for tasks assessed/performed             History reviewed. No pertinent past medical history.  History reviewed. No pertinent surgical history.  There were no vitals filed for this visit.    Subjective Assessment - 11/28/20 2107     Subjective Pt reports chronic bilateral foot/achilles pain since childhood. Primarily her biggest concern is her balance, tolerance to activity, and would like to see if her ain will decrease. Pt notes with being inactive during covid and not being able to find shoes and orthotics which are good for her feet, her bilateral foot/achilles pain increased. She states more recently, she has been able to find shoes, have orthotics repaired, and received injections fo both feet. With thses interventions, her bilat foot/achilles pain has improved. The R foot has achilles insertional and bottom of foot pain.The L foot has plantar heel pain. Pt wears DF splints at night consistently. Walks 5x a week.    Patient Stated Goals For the ankles to get stronger for better balance and to ascend /descend steps more easily, and to decrease pain as much as possible    Currently in Pain? Yes    Pain Score 4    6/10 at highest   Pain Location --   Achilles/heel   Pain Orientation Right     Pain Descriptors / Indicators Aching    Pain Type Chronic pain    Pain Frequency Constant    Aggravating Factors  Higher activity level    Pain Relieving Factors Stretching, cold packs    Multiple Pain Sites Yes    Pain Score 3   High level 3/10   Pain Location Heel    Pain Orientation Left    Pain Descriptors / Indicators Aching    Pain Type Chronic pain    Pain Onset More than a month ago    Pain Frequency Intermittent    Aggravating Factors  Higher activity level    Pain Relieving Factors Stretching, cold packs                [facility] PT Assessment - 11/28/20 0001       Assessment   Medical Diagnosis Insertional Achilles tendinopathy  M72.2 (ICD-10-CM) - Plantar fasciitis, bilateral    Referring Provider (PT) Bronson Ing, DPM    Onset Date/Surgical Date --   Chronic issue   Hand Dominance Right      Precautions   Precautions None      Restrictions   Weight Bearing Restrictions No      Balance Screen   Has the patient fallen in the past 6 months No      [place] residence  Living Arrangements Spouse/significant other    Type of [place] to enter    Entrance Stairs-Number of Steps 4    Entrance Stairs-Rails Right;Left    Home Layout Multi-level    Alternate Level Stairs-Number of Steps 8x2    Alternate Level Stairs-Rails Right;Left      Prior Function   Level of Independence Independent    Vocation Part time employment    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     Cognition   Overall Cognitive Status Within Functional Limits for tasks assessed      Observation/Other Assessments   Observations minimal plantar planus    Focus on Therapeutic Outcomes (FOTO)  NA      Observation/Other Assessments-Edema    Edema --   None observed or palpated     Sensation   Light Touch Appears Intact      ROM / Strength   AROM / PROM / Strength AROM;Strength       AROM   AROM Assessment Site Ankle    Right/Left Ankle Right;Left    Right Ankle Dorsiflexion 10    Left Ankle Dorsiflexion 10      Strength   Strength Assessment Site Ankle;Hip    Right/Left Hip Right;Left    Right Hip Flexion 4/5    Right Hip Extension 4/5    Right Hip External Rotation  4/5    Right Hip Internal Rotation 4+/5    Right Hip ABduction 4/5    Right Hip ADduction 4+/5    Left Hip Flexion 4/5    Left Hip Extension 4/5    Left Hip External Rotation 4/5    Left Hip Internal Rotation 4+/5    Left Hip ABduction 4/5    Left Hip ADduction 4+/5    Right/Left Ankle Right;Left    Right Ankle Dorsiflexion 5/5    Right Ankle Plantar Flexion 5/5    Right Ankle Inversion 5/5    Right Ankle Eversion 5/5    Left Ankle Dorsiflexion 5/5    Left Ankle Plantar Flexion 5/5    Left Ankle Inversion 5/5    Left Ankle Eversion 5/5      Special Tests   Other special tests Quick balance test: R ankle 20+ sec, L ankle 20+ sec      Transfers   Transfers Sit to Stand;Stand to Sit    Sit to Stand 7: Independent      Ambulation/Gait   Ambulation/Gait Yes    Ambulation/Gait Assistance 7: Independent    Gait Pattern Within Functional Limits;Step-through pattern                        Objective measurements completed on examination: See above findings.                PT Education - 11/28/20 1950     Education Details Eval finding, POC, HEP for hips strengthening    Person(s) Educated Patient    Methods Demonstration;Tactile cues;Explanation;Verbal cues;Handout    Comprehension Verbalized understanding;Returned demonstration;Verbal cues required;Tactile cues required              PT Short Term Goals - 11/28/20 2055       PT SHORT TERM GOAL #1   Title Pt will be Ind in an initial HEP    Baseline started on eval    Status New    Target Date 12/19/20  PT SHORT TERM GOAL #2   Title PT will voice understanding of measures to assist in the  redunxtion of her pain.    Status New    Target Date 12/19/20               PT Long Term Goals - 11/28/20 2057       PT LONG TERM GOAL #1   Title Increased bilat hip strength to 4+/5 to improved pt functional mobility    Status New    Target Date 01/30/21      PT LONG TERM GOAL #2   Title Pt will be able to ascend/descend 30 steps wihtout use of handrail and wihtout increase  in ain above her baseline    Status New    Target Date 01/30/21      PT LONG TERM GOAL #3   Title Pt will report a overall decreased in pain by 50% for either intensity level or frequency    Status New    Target Date 01/30/21      PT LONG TERM GOAL #4   Title Pt will be ind in a final HEP to maintain achieved level of function    Status New    Target Date 01/30/21                    Plan - 11/28/20 1943     Clinical Impression Statement Pt present to PT with a chronc Hx of bilat Achilles and plantar foot pain with the most recent episode occuring after decreased activity with covid and other foot care issues. With recent interventions, the pt reports her pain has improving. (see Subjective). Pt notes she would like her ankles to get stronger for better balance, to ascend/descend steps more easily, and for the pain to decrease as much as possible. PT eval found bilat ankle ROM and strength to be WNLs. Hip strength was found to be decreased bilat, so an initial HEP was developed to for strengthening the pt's hips to help address her functionally concerns. Pt will also benefit from skilled PT for ankle inventions as indicated and care recommendation for achilles tendinitis/plantar fasciitis.    Personal Factors and Comorbidities Past/Current Experience;Time since onset of injury/illness/exacerbation;Age    Examination-Activity Limitations Locomotion Level;Stairs    Stability/Clinical Decision Making Stable/Uncomplicated    Clinical Decision Making Low    Rehab Potential Good    PT Frequency 1x  / week    PT Duration 8 weeks    PT Treatment/Interventions ADLs/Self Care Home Management;Cryotherapy;Electrical Stimulation;Iontophoresis 4mg /ml Dexamethasone;Moist Heat;Ultrasound;Therapeutic exercise;Therapeutic activities;Functional mobility training;Stair training;Gait training;Patient/family education;Manual techniques;Dry needling;Passive range of motion;Taping    PT Next Visit Plan Assess response to HEP    PT Home Exercise Plan [email or web address]    Consulted and Agree with Plan of Care Patient             Patient will benefit from skilled therapeutic intervention in order to improve the following deficits and impairments:  Difficulty walking, Decreased strength, Pain  Visit Diagnosis: Insertional Achilles tendinopathy - Plan: PT plan of care cert/re-cert  Plantar fasciitis, bilateral - Plan: PT plan of care cert/re-cert  Muscle weakness (generalized) - Plan: PT plan of care cert/re-cert     Problem List There are no problems to display for this patient.   Gar Ponto MS, PT 11/28/20 9:12 PM   [facility] [address] [place], [place], [address] Phone: [phone]   Fax:  [phone]  Name: Lisa Contreras MRN: [id number] Date of Birth: Jun 25, 1955

## 2020-12-04 ENCOUNTER — Other Ambulatory Visit: Payer: Medicare Other

## 2020-12-11 ENCOUNTER — Other Ambulatory Visit: Payer: Self-pay

## 2020-12-11 ENCOUNTER — Ambulatory Visit: Payer: Medicare Other | Attending: Podiatrist

## 2020-12-11 DIAGNOSIS — M722 Plantar fascial fibromatosis: Secondary | ICD-10-CM | POA: Insufficient documentation

## 2020-12-11 DIAGNOSIS — M6281 Muscle weakness (generalized): Secondary | ICD-10-CM | POA: Insufficient documentation

## 2020-12-11 DIAGNOSIS — M7660 Achilles tendinitis, unspecified leg: Secondary | ICD-10-CM | POA: Insufficient documentation

## 2020-12-11 NOTE — Therapy (Signed)
[place], [place], [address] Phone: [phone]   Fax:  [phone]  Physical Therapy Treatment  Patient Details  Name: Lisa Contreras MRN: [id number] Date of Birth: 04-15-1955 Referring Provider (PT): Bronson Ing, [place]   Encounter Date: 12/11/2020   PT End of Session - 12/11/20 1235     Visit Number 2    Number of Visits 9    Date for PT Re-Evaluation 01/30/21    Authorization Type MEDICARE PART A AND B, [employer] SENIOR SUPPLEMENTAL INSURANCE    PT Start Time 1019    PT Stop Time 1101    PT Time Calculation (min) 42 min    Activity Tolerance Patient tolerated treatment well    Behavior During Therapy [facility] for tasks assessed/performed             History reviewed. No pertinent past medical history.  History reviewed. No pertinent surgical history.  There were no vitals filed for this visit.   Subjective Assessment - 12/11/20 1022     Subjective Pt reports she is doing better and she is consistent with completing her HEP 1 to 2x a day    Patient Stated Goals For the ankles to get stronger for better balance and to ascend /descend steps more easily, and to decrease pain as much as possible    Currently in Pain? Yes    Pain Score 4     Pain Location --   achilles/heel   Pain Orientation Right    Pain Descriptors / Indicators Aching    Pain Type Chronic pain    Pain Onset More than a month ago    Pain Frequency Constant    Aggravating Factors  Higher activity level    Pain Relieving Factors Stretching, cold packs    Pain Score 3    Pain Location Heel    Pain Orientation Left    Pain Descriptors / Indicators Aching    Pain Type Chronic pain    Pain Onset More than a month ago    Pain Frequency Intermittent    Aggravating Factors  Higher activity level    Pain Relieving Factors Stretching, cold packs              +[facility] Adult PT Treatment/Exercise:  Therapeutic Exercise: -  Bridging 20x - SLR, L and R, 10, 3# - SL clams, Land R, x15, GTB - SL hip abd, L and R, x15, GTB - Prone hip ext, L and R, x15, 3# - Prone knee flexion, L and R, 3# - Standing soleus stretch, L and R, 3 x 20" - Lateral step ups, L and R, 1 x 10, 6'   Manual Therapy: - na  Neuromuscular re-ed: - na  Therapeutic Activity: - na                              PT Education - 12/11/20 1234     Education Details Updated HEP for soleus stretch, hamstring strengthening, and lateral step ups. Recommended cuff weights for LE strengthening exs.    Person(s) Educated Patient    Methods Explanation;Demonstration;Tactile cues;Verbal cues;Handout    Comprehension Verbalized understanding;Returned demonstration;Verbal cues required;Tactile cues required;Need further instruction              PT Short Term Goals - 11/28/20 2055       PT SHORT TERM GOAL #1   Title Pt will be Ind  in an initial HEP    Baseline started on eval    Status New    Target Date 12/19/20      PT SHORT TERM GOAL #2   Title PT will voice understanding of measures to assist in the redunxtion of her pain.    Status New    Target Date 12/19/20               PT Long Term Goals - 11/28/20 2057       PT LONG TERM GOAL #1   Title Increased bilat hip strength to 4+/5 to improved pt functional mobility    Status New    Target Date 01/30/21      PT LONG TERM GOAL #2   Title Pt will be able to ascend/descend 30 steps wihtout use of handrail and wihtout increase  in ain above her baseline    Status New    Target Date 01/30/21      PT LONG TERM GOAL #3   Title Pt will report a overall decreased in pain by 50% for either intensity level or frequency    Status New    Target Date 01/30/21      PT LONG TERM GOAL #4   Title Pt will be ind in a final HEP to maintain achieved level of function    Status New    Target Date 01/30/21                   Plan - 12/11/20 1236      Clinical Impression Statement Pt returns to PT after the initial eval. Pt reports completing her HEP 1-2x daily and she pleased with her HEP. Today in PT, pt's Ther ex/HEP was reviewed with pt completing the exs with proper technique. For OCK exs, 3# ankle weights were used for greater resistance which the pt tolerated. Pt is to look into obtaining cuff weights for HEP at home. New exs were added for hamstring strengtheing, for CKC ex with lateral step ups, and for soleus stretching ( pt is already completing a gastroc stretch). Pt tolerated today's session without adverse effects. Pt will continue to benefit from skilled PT to address deficits to optimize functional mobility.    Personal Factors and Comorbidities Past/Current Experience;Time since onset of injury/illness/exacerbation;Age    Examination-Activity Limitations Locomotion Level;Stairs    Stability/Clinical Decision Making Stable/Uncomplicated    Clinical Decision Making Low    Rehab Potential Good    PT Frequency 1x / week    PT Duration 8 weeks    PT Treatment/Interventions ADLs/Self Care Home Management;Cryotherapy;Electrical Stimulation;Iontophoresis 4mg /ml Dexamethasone;Moist Heat;Ultrasound;Therapeutic exercise;Therapeutic activities;Functional mobility training;Stair training;Gait training;Patient/family education;Manual techniques;Dry needling;Passive range of motion;Taping    PT Next Visit Plan Assess response to HEP. Progress ther ex. Add 4 way theraband exs for ankle strengthening    PT Home Exercise Plan [id number]    Consulted and Agree with Plan of Care Patient             Patient will benefit from skilled therapeutic intervention in order to improve the following deficits and impairments:  Difficulty walking, Decreased strength, Pain  Visit Diagnosis: Insertional Achilles tendinopathy  Muscle weakness (generalized)  Plantar fasciitis, bilateral     Problem List There are no problems to display for this  patient.   Gar Ponto MS, PT 12/11/20 12:50 PM   [facility] [address] [place], [place], [address] Phone: [phone]   Fax:  [phone]  Name:  Lisa Contreras MRN: [id number] Date of Birth: 12/10/1955

## 2020-12-15 ENCOUNTER — Encounter: Payer: Self-pay | Admitting: Physical Therapy

## 2020-12-15 ENCOUNTER — Other Ambulatory Visit: Payer: Self-pay

## 2020-12-15 ENCOUNTER — Ambulatory Visit: Payer: Medicare Other | Admitting: Physical Therapy

## 2020-12-15 DIAGNOSIS — M6281 Muscle weakness (generalized): Secondary | ICD-10-CM

## 2020-12-15 DIAGNOSIS — M7660 Achilles tendinitis, unspecified leg: Secondary | ICD-10-CM

## 2020-12-15 DIAGNOSIS — M722 Plantar fascial fibromatosis: Secondary | ICD-10-CM

## 2020-12-15 NOTE — Therapy (Signed)
[place], [place], [address] Phone: [phone]   Fax:  [phone]  Physical Therapy Treatment  Patient Details  Name: Lisa Contreras MRN: [id number] Date of Birth: 1956/01/09 Referring Provider (PT): Bronson Ing, [place]   Encounter Date: 12/15/2020   PT End of Session - 12/15/20 0805     Visit Number 3    Number of Visits 9    Date for PT Re-Evaluation 01/30/21    Authorization Type MEDICARE PART A AND B, [employer] SENIOR SUPPLEMENTAL INSURANCE    PT Start Time 0800    PT Stop Time 0843    PT Time Calculation (min) 43 min             History reviewed. No pertinent past medical history.  History reviewed. No pertinent surgical history.  There were no vitals filed for this visit.   Subjective Assessment - 12/15/20 0804     Subjective Constant pain in both heels rated at 1-2/10. She reports the pain is coming down.    Currently in Pain? Yes    Pain Score 2     Pain Location Heel    Pain Orientation Right;Left    Pain Descriptors / Indicators Aching    Pain Type Chronic pain              +[facility] Adult PT Treatment/Exercise:   Therapeutic Exercise: -Rec bike L2 - Bridging 30x added blue theraband  - SLR, L and R, 10, 3# - SL clams, L and R, x 10, BTB - SL hip abd, L and R, 10 x 2 2# - Prone hip ext, L and R, 10 x 1 2#- cues for technique - Prone knee flexion, L and R, 3# (not today) - Standing soleus stretch, L and R, 3 x 20" - Lateral step ups, L and R, 2 x 10, 6'  - 4 way ankle red band seated , bilat- mod cues    Manual Therapy: - na   Neuromuscular re-ed: - na   Therapeutic Activity: - na    PT Short Term Goals - 11/28/20 2055       PT SHORT TERM GOAL #1   Title Pt will be Ind in an initial HEP    Baseline started on eval    Status New    Target Date 12/19/20      PT SHORT TERM GOAL #2   Title PT will voice understanding of measures to assist in the  redunxtion of her pain.    Status New    Target Date 12/19/20               PT Long Term Goals - 11/28/20 2057       PT LONG TERM GOAL #1   Title Increased bilat hip strength to 4+/5 to improved pt functional mobility    Status New    Target Date 01/30/21      PT LONG TERM GOAL #2   Title Pt will be able to ascend/descend 30 steps wihtout use of handrail and wihtout increase  in ain above her baseline    Status New    Target Date 01/30/21      PT LONG TERM GOAL #3   Title Pt will report a overall decreased in pain by 50% for either intensity level or frequency    Status New    Target Date 01/30/21      PT LONG TERM GOAL #4   Title Pt  will be ind in a final HEP to maintain achieved level of function    Status New    Target Date 01/30/21                   Plan - 12/15/20 0831     Clinical Impression Statement Pt reports PT is helping and pain is reducing overall in bilateral feet. She has purchased ankle weights and is starting with 1# at home. Today used 2# for SLR series. Progressed with ankle 4way theraband strengthening and updated HEP. She reported no increased pain.    PT Treatment/Interventions ADLs/Self Care Home Management;Cryotherapy;Electrical Stimulation;Iontophoresis 4mg /ml Dexamethasone;Moist Heat;Ultrasound;Therapeutic exercise;Therapeutic activities;Functional mobility training;Stair training;Gait training;Patient/family education;Manual techniques;Dry needling;Passive range of motion;Taping    PT Next Visit Plan Assess response to HEP. Progress ther ex. review 4 way theraband exs for ankle strengthening    PT Home Exercise Plan [id number]             Patient will benefit from skilled therapeutic intervention in order to improve the following deficits and impairments:  Difficulty walking, Decreased strength, Pain  Visit Diagnosis: Insertional Achilles tendinopathy  Plantar fasciitis, bilateral  Muscle weakness (generalized)     Problem  List There are no problems to display for this patient.   Dorene Ar, [place] 12/15/2020, 9:09 AM  [place], [place], [address] Phone: [phone]   Fax:  [phone]  Name: Lisa Contreras MRN: [id number] Date of Birth: 29-Oct-1955

## 2020-12-25 ENCOUNTER — Other Ambulatory Visit: Payer: Self-pay

## 2020-12-25 ENCOUNTER — Ambulatory Visit: Payer: Medicare Other

## 2020-12-25 DIAGNOSIS — M722 Plantar fascial fibromatosis: Secondary | ICD-10-CM

## 2020-12-25 DIAGNOSIS — M6281 Muscle weakness (generalized): Secondary | ICD-10-CM

## 2020-12-25 DIAGNOSIS — M7660 Achilles tendinitis, unspecified leg: Secondary | ICD-10-CM

## 2020-12-25 NOTE — Therapy (Signed)
[place], [place], [address] Phone: [phone]   Fax:  [phone]  Physical Therapy Treatment  Patient Details  Name: Lisa Contreras MRN: [id number] Date of Birth: March 17, 1955 Referring Provider (PT): Bronson Ing, [place]   Encounter Date: 12/25/2020   PT End of Session - 12/25/20 0851     Visit Number 4    Date for PT Re-Evaluation 01/30/21    Authorization Type MEDICARE PART A AND B, [employer] SENIOR SUPPLEMENTAL INSURANCE    PT Start Time [phone]    PT Stop Time [phone]    PT Time Calculation (min) 40 min    Activity Tolerance Patient tolerated treatment well    Behavior During Therapy [facility] for tasks assessed/performed             History reviewed. No pertinent past medical history.  History reviewed. No pertinent surgical history.  There were no vitals filed for this visit.   Subjective Assessment - 12/25/20 0819     Subjective Pt reports she is continuing to experience improved relief of her calf/heel pain bilat. Pt reports the therband exs are going well.    Patient Stated Goals For the ankles to get stronger for better balance and to ascend /descend steps more easily, and to decrease pain as much as possible    Currently in Pain? Yes    Pain Score 2     Pain Location Heel    Pain Orientation Right    Pain Descriptors / Indicators Aching    Pain Type Chronic pain    Pain Onset More than a month ago    Pain Frequency Constant    Pain Score 1    Pain Location Heel    Pain Orientation Left    Pain Descriptors / Indicators Aching    Pain Type Chronic pain    Pain Onset More than a month ago    Pain Frequency Intermittent                            [facility] Adult PT Treatment/Exercise:  Therapeutic Exercise: - Rec bike L2, 5 mins - Slant board, gastroc and soleus, 1 min each - Sit to stand form mat table, 2x10 - Heel raise 10x, Heel raise 2x10 concentric, x10 L and r  eccentric - Side steps c GTB, 4x20" - RDL, no wt, to 12" cone    Manual Therapy: - na   Neuromuscular re-ed: - na   Therapeutic Activity: - na  Not completed this session: - Bridging 30x added blue theraband  - SLR, L and R, 10, 3# - SL clams, L and R, x 10, BTB - SL hip abd, L and R, 10 x 2 2# - Prone hip ext, L and R, 10 x 1 2#- cues for technique - Prone knee flexion, L and R, 3# (not today) - Standing soleus stretch, L and R, 3 x 20" - Lateral step ups, L and R, 2 x 10, 6'  - 4 way ankle red band seated , bilat- mod cues              PT Education - 12/25/20 0854     Education Details DIscussed TPDN and possible intervention the next PT session. Pt stated she was open to its intervention.    Person(s) Educated Patient    Methods Explanation    Comprehension Verbalized understanding  PT Short Term Goals - 12/25/20 0853       PT SHORT TERM GOAL #1   Title Pt will be Ind in an initial HEP    Baseline started on eval    Status Achieved    Target Date 12/25/20      PT SHORT TERM GOAL #2   Title PT will voice understanding of measures to assist in the redunxtion of her pain.    Status Achieved    Target Date 12/25/20               PT Long Term Goals - 11/28/20 2057       PT LONG TERM GOAL #1   Title Increased bilat hip strength to 4+/5 to improved pt functional mobility    Status New    Target Date 01/30/21      PT LONG TERM GOAL #2   Title Pt will be able to ascend/descend 30 steps wihtout use of handrail and wihtout increase  in ain above her baseline    Status New    Target Date 01/30/21      PT LONG TERM GOAL #3   Title Pt will report a overall decreased in pain by 50% for either intensity level or frequency    Status New    Target Date 01/30/21      PT LONG TERM GOAL #4   Title Pt will be ind in a final HEP to maintain achieved level of function    Status New    Target Date 01/30/21                    Plan - 12/25/20 1761     Clinical Impression Statement Pt is continuing to make appropriate progress re: her calf/heel pain bilat. PT today addressed flexibility of the calves and strengthening for the ankles and hips with progression in demand. Pt tolerated the session without adverse effects. Pt will continue to benefit from skilled PT to address deficits to optimize function of her ankles/foot/LEs.    Personal Factors and Comorbidities Past/Current Experience;Time since onset of injury/illness/exacerbation;Age    Examination-Activity Limitations Locomotion Level;Stairs    Stability/Clinical Decision Making Stable/Uncomplicated    Clinical Decision Making Low    PT Frequency 1x / week    PT Duration 8 weeks    PT Treatment/Interventions ADLs/Self Care Home Management;Cryotherapy;Electrical Stimulation;Iontophoresis 4mg /ml Dexamethasone;Moist Heat;Ultrasound;Therapeutic exercise;Therapeutic activities;Functional mobility training;Stair training;Gait training;Patient/family education;Manual techniques;Dry needling;Passive range of motion;Taping    PT Next Visit Plan Assess response to HEP. Progress ther ex. review 4 way theraband exs for ankle strengthening    PT Home Exercise Plan [email or web address]    Consulted and Agree with Plan of Care Patient             Patient will benefit from skilled therapeutic intervention in order to improve the following deficits and impairments:  Difficulty walking, Decreased strength, Pain  Visit Diagnosis: Insertional Achilles tendinopathy  Plantar fasciitis, bilateral  Muscle weakness (generalized)     Problem List There are no problems to display for this patient.   Gar Ponto MS, PT 12/25/20 9:07 AM   [facility] [address] [place], [place], [address] Phone: [phone]   Fax:  [phone]  Name: FREEDA SPIVEY MRN: [id number] Date of Birth: November 08, 1955

## 2020-12-29 ENCOUNTER — Other Ambulatory Visit: Payer: Self-pay

## 2020-12-29 ENCOUNTER — Ambulatory Visit: Payer: Medicare Other

## 2020-12-29 DIAGNOSIS — M7660 Achilles tendinitis, unspecified leg: Secondary | ICD-10-CM

## 2020-12-29 DIAGNOSIS — M6281 Muscle weakness (generalized): Secondary | ICD-10-CM

## 2020-12-29 DIAGNOSIS — M722 Plantar fascial fibromatosis: Secondary | ICD-10-CM

## 2020-12-29 NOTE — Therapy (Signed)
[place], [place], [address] Phone: [phone]   Fax:  [phone]  Physical Therapy Treatment  Patient Details  Name: Lisa Contreras MRN: [id number] Date of Birth: January 01, 1956 Referring Provider (PT): Bronson Ing, [place]   Encounter Date: 12/29/2020   PT End of Session - 12/29/20 0933     Visit Number 5    Number of Visits 9    Date for PT Re-Evaluation 01/30/21    Authorization Type MEDICARE PART A AND B, [employer] SENIOR SUPPLEMENTAL INSURANCE    PT Start Time 0845    PT Stop Time 0930    PT Time Calculation (min) 45 min    Activity Tolerance Patient tolerated treatment well    Behavior During Therapy [facility] for tasks assessed/performed             History reviewed. No pertinent past medical history.  History reviewed. No pertinent surgical history.  There were no vitals filed for this visit.       [facility] Adult PT Treatment/Exercise:   Therapeutic Exercise: - Rec bike L2, 3 mins - Slant board, gastroc and soleus, 1 min each - Resisted forward and backward walking c freemotion, 10# - Heel raise 10x, Heel raise 2x10 concentric, x10 L and R eccentric - Side steps c GTB around forefeet, 4x15" - RDL, 2x10, 8#, to 2" above floor   Manual Therapy: - STM to the plantar foot and posterior calf f/b instruction and completion of IASTM roller massage by both the PT and pt.   Neuromuscular re-ed: - na   Therapeutic Activity: - na   Not completed this session: - Bridging 30x added blue theraband  - SLR, L and R, 10, 3# - SL clams, L and R, x 10, BTB - SL hip abd, L and R, 10 x 2 2# - Prone hip ext, L and R, 10 x 1 2#- cues for technique - Prone knee flexion, L and R, 3# (not today) - Standing soleus stretch, L and R, 3 x 20" - Lateral step ups, L and R, 2 x 10, 6' - 4 way ankle red band seated , bilat- mod cues  - Sit to stand form mat table,  2x10                         PT Education - 12/29/20 0928     Education Details Roller IASTM massage to calfs and CFM to the achilles tendon    Person(s) Educated Patient    Methods Explanation;Demonstration    Comprehension Verbalized understanding;Returned demonstration              PT Short Term Goals - 12/25/20 0853       PT SHORT TERM GOAL #1   Title Pt will be Ind in an initial HEP    Baseline started on eval    Status Achieved    Target Date 12/25/20      PT SHORT TERM GOAL #2   Title PT will voice understanding of measures to assist in the redunxtion of her pain.    Status Achieved    Target Date 12/25/20               PT Long Term Goals - 11/28/20 2057       PT LONG TERM GOAL #1   Title Increased bilat hip strength to 4+/5 to improved pt functional mobility    Status New  Target Date 01/30/21      PT LONG TERM GOAL #2   Title Pt will be able to ascend/descend 30 steps wihtout use of handrail and wihtout increase  in ain above her baseline    Status New    Target Date 01/30/21      PT LONG TERM GOAL #3   Title Pt will report a overall decreased in pain by 50% for either intensity level or frequency    Status New    Target Date 01/30/21      PT LONG TERM GOAL #4   Title Pt will be ind in a final HEP to maintain achieved level of function    Status New    Target Date 01/30/21                   Plan - 12/29/20 1758     Clinical Impression Statement PT was continued to address flexibility of the bilat achilles and strengthening of bilat ankle/LEs. Additionaly, education was provided for roller masssage to the calves and CFM to the achilles tendon. Pt continus to report decreasing achilles/heel pain. Will reassess nets pt session with possible DC if pt status continues to remain improved.    Personal Factors and Comorbidities Past/Current Experience;Time since onset of injury/illness/exacerbation;Age     Examination-Activity Limitations Locomotion Level;Stairs    Stability/Clinical Decision Making Stable/Uncomplicated    Clinical Decision Making Low    Rehab Potential Good    PT Frequency 1x / week    PT Duration 8 weeks    PT Treatment/Interventions ADLs/Self Care Home Management;Cryotherapy;Electrical Stimulation;Iontophoresis 4mg /ml Dexamethasone;Moist Heat;Ultrasound;Therapeutic exercise;Therapeutic activities;Functional mobility training;Stair training;Gait training;Patient/family education;Manual techniques;Dry needling;Passive range of motion;Taping    PT Next Visit Plan Reassess    PT Home Exercise Plan [email or web address]    Consulted and Agree with Plan of Care Patient             Patient will benefit from skilled therapeutic intervention in order to improve the following deficits and impairments:  Difficulty walking, Decreased strength, Pain  Visit Diagnosis: Insertional Achilles tendinopathy  Plantar fasciitis, bilateral  Muscle weakness (generalized)     Problem List There are no problems to display for this patient.   Gar Ponto MS, PT 12/29/20 6:04 PM    [place] [facility] [address] [place], [place], [address] Phone: [phone]   Fax:  [phone]  Name: Lisa Contreras MRN: [id number] Date of Birth: April 12, 1955

## 2021-01-06 ENCOUNTER — Ambulatory Visit: Payer: Medicare Other

## 2021-01-06 ENCOUNTER — Other Ambulatory Visit: Payer: Self-pay

## 2021-01-06 DIAGNOSIS — M7660 Achilles tendinitis, unspecified leg: Secondary | ICD-10-CM | POA: Diagnosis not present

## 2021-01-06 DIAGNOSIS — M722 Plantar fascial fibromatosis: Secondary | ICD-10-CM

## 2021-01-06 DIAGNOSIS — M6281 Muscle weakness (generalized): Secondary | ICD-10-CM

## 2021-01-06 NOTE — Therapy (Addendum)
[place], [place], [address] Phone: [phone]   Fax:  [phone]  Physical Therapy Treatment/Discharge  Patient Details  Name: Lisa Contreras MRN: [id number] Date of Birth: 1955/04/04 Referring Provider (PT): Bronson Ing, [place]   Encounter Date: 01/06/2021   PT End of Session - 01/06/21 1740     Visit Number 6    Number of Visits 9    Date for PT Re-Evaluation 01/30/21    Authorization Type MEDICARE PART A AND B, [employer] SENIOR SUPPLEMENTAL INSURANCE    PT Start Time 7322    PT Stop Time 1829    PT Time Calculation (min) 44 min    Activity Tolerance Patient tolerated treatment well    Behavior During Therapy [facility] for tasks assessed/performed             No past medical history on file.  No past surgical history on file.  There were no vitals filed for this visit.   Subjective Assessment - 01/06/21 1741     Subjective Pt presents to PT with reports of continued slight bilat heel pain, although she is continuing to report improvement in status. Has continued to be compliant with her HEP with no adverse effect. She is ready to begin PT treatment at this time.    Currently in Pain? Yes    Pain Score 1     Pain Orientation Right           [facility] Adult PT Treatment/Exercise:   Therapeutic Exercise:  Rec bike L3, 4 mins Slant board, gastroc and soleus, 2x60" Heel raise 10x, Heel raise 2x10 concentric, x10 L and R eccentric Side steps c GTB around forefeet, 4x15 RDL, 2x10, 8#, to 2" above floor Bilateral ankle 4way blue tband 2x10 ea  Manual Therapy: STM to bilateral gastroc and IASTM to bilateral plantar surfaces   Neuromuscular re-ed: N/A   Therapeutic Activity: N/A   Modalities: N/A   Self Care: N/A   Consider / progression for next session:                                PT Short Term Goals - 12/25/20 0853       PT SHORT TERM GOAL #1    Title Pt will be Ind in an initial HEP    Baseline started on eval    Status Achieved    Target Date 12/25/20      PT SHORT TERM GOAL #2   Title PT will voice understanding of measures to assist in the redunxtion of her pain.    Status Achieved    Target Date 12/25/20               PT Long Term Goals - 11/28/20 2057       PT LONG TERM GOAL #1   Title Increased bilat hip strength to 4+/5 to improved pt functional mobility    Status New    Target Date 01/30/21      PT LONG TERM GOAL #2   Title Pt will be able to ascend/descend 30 steps wihtout use of handrail and wihtout increase  in ain above her baseline    Status New    Target Date 01/30/21      PT LONG TERM GOAL #3   Title Pt will report a overall decreased in pain by 50% for either intensity level or frequency  Status New    Target Date 01/30/21      PT LONG TERM GOAL #4   Title Pt will be ind in a final HEP to maintain achieved level of function    Status New    Target Date 01/30/21                   Plan - 01/06/21 1833     Clinical Impression Statement Pt was able to complete all prescribed exercises with no adverse effect or increase in pain. She responded well to manual therapy interventions, noting decreased discomfort post session. Pt continues to progress well with therapy, will continue to progress as tolerated as she awaits custom orthotic fitting with DPM.    PT Treatment/Interventions ADLs/Self Care Home Management;Cryotherapy;Electrical Stimulation;Iontophoresis 4mg /ml Dexamethasone;Moist Heat;Ultrasound;Therapeutic exercise;Therapeutic activities;Functional mobility training;Stair training;Gait training;Patient/family education;Manual techniques;Dry needling;Passive range of motion;Taping    PT Next Visit Plan reassess goals and continue with progression as tolerated    PT Home Exercise Plan [id number]             Patient will benefit from skilled therapeutic intervention in order to  improve the following deficits and impairments:  Difficulty walking, Decreased strength, Pain  Visit Diagnosis: Muscle weakness (generalized)  Insertional Achilles tendinopathy  Plantar fasciitis, bilateral     Problem List There are no problems to display for this patient.   Ward Chatters, PT 01/06/2021, 6:36 PM  [facility] [address] [place], [place], [address] Phone: [phone]   Fax:  [phone]  Name: Lisa Contreras MRN: [id number] Date of Birth: May 23, 1955   PHYSICAL THERAPY DISCHARGE SUMMARY  Visits from Start of Care: 6  Current functional level related to goals / functional outcomes: Unable to assess   Remaining deficits: Unable to assess   Education / Equipment: Unable to assess   Patient agrees to discharge. Patient goals were  Unable to assess . Patient is being discharged due to not returning since the last visit.

## 2021-01-08 ENCOUNTER — Other Ambulatory Visit: Payer: Medicare Other

## 2021-01-08 ENCOUNTER — Other Ambulatory Visit: Payer: Self-pay

## 2021-01-08 ENCOUNTER — Ambulatory Visit: Payer: Medicare Other

## 2021-01-08 DIAGNOSIS — M722 Plantar fascial fibromatosis: Secondary | ICD-10-CM

## 2021-01-08 DIAGNOSIS — M7660 Achilles tendinitis, unspecified leg: Secondary | ICD-10-CM

## 2021-01-11 NOTE — Progress Notes (Signed)
SITUATION Reason for Consult: Evaluation for Bilateral Custom Foot Orthoses Patient / Caregiver Report: Patient is unsatisfied with existing orthotics. Here for re-do under Dr. Pearlie Oyster discretion  OBJECTIVE DATA: Patient History / Diagnosis: Insertional Achilles tendinopathy  Plantar fasciitis, bilateral  Current or Previous Devices: Existing hard shell orthoses  Foot Examination: Skin presentation:   Intact Ulcers & Callousing:   None and no history Toe / Foot Deformities:  Pes Cavus Weight Bearing Presentation:  Cavus Sensation:    Intact  ORTHOTIC RECOMMENDATION Recommended Device: 1x pair of custom functional orthoses  GOALS OF ORTHOSES - Reduce Pain - Prevent Foot Deformity - Prevent Progression of Further Foot Deformity - Relieve Pressure - Improve the Overall Biomechanical Function of the Foot and Lower Extremity.  ACTIONS PERFORMED Patient was casted for Foot Orthoses via crush box. Procedure was explained and patient tolerated procedure well. All questions were answered and concerns addressed.  PLAN Potential out of pocket cost was communicated to patient. Casts are to be sent to [facility] for fabrication. Patient is to be called for fitting when devices are ready.

## 2021-02-09 ENCOUNTER — Other Ambulatory Visit: Payer: Self-pay

## 2021-02-09 ENCOUNTER — Ambulatory Visit: Payer: Medicare Other

## 2021-02-09 DIAGNOSIS — M722 Plantar fascial fibromatosis: Secondary | ICD-10-CM

## 2021-02-09 DIAGNOSIS — M7660 Achilles tendinitis, unspecified leg: Secondary | ICD-10-CM

## 2021-02-09 NOTE — Progress Notes (Signed)
SITUATION: Reason for Visit: Fitting and Delivery of Custom Fabricated Foot Orthoses Patient Report: Patient reports comfort and is satisfied with device.  OBJECTIVE DATA: Patient History / Diagnosis:     ICD-10-CM   1. Insertional Achilles tendinopathy  M76.60     2. Plantar fasciitis, bilateral  M72.2       Provided Device:  Functional foot orthotics  GOAL OF ORTHOSIS - Improve gait - Decrease energy expenditure - Improve Balance - Provide Triplanar stability of foot complex - Facilitate motion  ACTIONS PERFORMED Patient was fit with foot orthotics trimmed to shoe last. Patient tolerated fittign procedure. Device was modified as follows to better fit patient: - Toe plate was trimmed to shoe last  Patient was provided with verbal and written instruction and demonstration regarding donning, doffing, wear, care, proper fit, function, purpose, cleaning, and use of the orthosis and in all related precautions and risks and benefits regarding the orthosis.  Patient was also provided with verbal instruction regarding how to report any failures or malfunctions of the orthosis and necessary follow up care. Patient was also instructed to contact our office regarding any change in status that may affect the function of the orthosis.  Patient demonstrated independence with proper donning, doffing, and fit and verbalized understanding of all instructions.  PLAN: Patient is to follow up in one week or as necessary (PRN). All questions were answered and concerns addressed. Plan of care was discussed with and agreed upon by the patient.

## 2021-02-10 ENCOUNTER — Ambulatory Visit
Admission: RE | Admit: 2021-02-10 | Discharge: 2021-02-10 | Disposition: A | Discharge status: Home / Self Care | Payer: Medicare Other | Source: Ambulatory Visit | Attending: Family Medicine | Admitting: Family Medicine

## 2021-02-10 DIAGNOSIS — E2839 Other primary ovarian failure: Secondary | ICD-10-CM

## 2021-07-09 ENCOUNTER — Other Ambulatory Visit: Payer: Self-pay | Admitting: Family Medicine

## 2021-07-09 DIAGNOSIS — Z1231 Encounter for screening mammogram for malignant neoplasm of breast: Secondary | ICD-10-CM

## 2021-08-30 ENCOUNTER — Ambulatory Visit
Admission: RE | Admit: 2021-08-30 | Discharge: 2021-08-30 | Disposition: A | Discharge status: Home / Self Care | Payer: Medicare Other | Source: Ambulatory Visit | Attending: Family Medicine | Admitting: Family Medicine

## 2021-08-30 DIAGNOSIS — Z1231 Encounter for screening mammogram for malignant neoplasm of breast: Secondary | ICD-10-CM

## 2022-07-08 ENCOUNTER — Other Ambulatory Visit: Payer: Self-pay | Admitting: Family Medicine

## 2022-07-08 DIAGNOSIS — Z1231 Encounter for screening mammogram for malignant neoplasm of breast: Secondary | ICD-10-CM

## 2022-09-02 ENCOUNTER — Ambulatory Visit
Admission: RE | Admit: 2022-09-02 | Discharge: 2022-09-02 | Disposition: A | Discharge status: Home / Self Care | Payer: Medicare Other | Source: Ambulatory Visit | Attending: Family Medicine | Admitting: Family Medicine

## 2022-09-02 DIAGNOSIS — Z1231 Encounter for screening mammogram for malignant neoplasm of breast: Secondary | ICD-10-CM

## 2022-12-07 NOTE — Progress Notes (Unsigned)
[facility] [address] [place] [address] Phone: [phone] Subjective:   INadine Counts, am serving as a scribe for Dr. Antoine Primas.  I'm seeing this patient by the request  of:  Lewis Moccasin, MD  CC: Complaint of Beese  [id number]  Lisa Contreras is a 67 y.o. female coming in with complaint of arthritis. Having a lot of cyst and wondering where she should be referred. Referred by Danelle Berry (surgical center).  The patient has had a couple different abscess over the course of time.  This has been in her mouth. Pathology did show that it was a normal size.  Patient has had other difficulties and has been having the of fibrous changes.  Has had a growth on her hands previously and has had surgical intervention.     Patient did had a bone density in 2023 showing the patient did have osteopenia.  Due again in January 2025  No past medical history on file. No past surgical history on file. Social History   Socioeconomic History   Marital status: Married    Spouse name: Not on file   Number of children: Not on file   Years of education: Not on file   Highest education level: Not on file  Occupational History   Not on file  Tobacco Use   Smoking status: Not on file   Smokeless tobacco: Not on file  Substance and Sexual Activity   Alcohol use: Not on file   Drug use: Not on file   Sexual activity: Not on file  Other Topics Concern   Not on file  Social History Narrative   Not on file   Social Determinants of Health   Financial Resource Strain: Not on file  Food Insecurity: Not on file  Transportation Needs: Not on file  Physical Activity: Not on file  Stress: Not on file  Social Connections: Unknown (06/18/2021)   Received from [facility], [facility]   Social Network    Social Network: Not on file   Allergies  Allergen Reactions   Aspirin Anaphylaxis, Hives, Itching, Shortness Of Breath and Other (See Comments)    Elemental Sulfur Anaphylaxis, Hives, Itching, Rash, Shortness Of Breath and Swelling   Penicillins Anaphylaxis, Hives, Itching, Nausea And Vomiting, Rash, Shortness Of Breath and Other (See Comments)   Sulfa Antibiotics Anaphylaxis   Tinidazole Other (See Comments)   Chocolate Other (See Comments)   Codeine Itching and Other (See Comments)   Other Other (See Comments)   Vitamin C Other (See Comments)   Pseudoephedrine-Codeine-Gg Hives, Itching and Palpitations   Family History  Problem Relation Age of Onset   Breast cancer Paternal Aunt    Breast cancer Paternal Grandmother    No current outpatient medications on file.   Reviewed prior external information including notes and imaging from  primary care provider As well as notes that were available from care everywhere and other healthcare systems.  Past medical history, social, surgical and family history all reviewed in electronic medical record.  No pertanent information unless stated regarding to the chief complaint.   Review of Systems:  No headache, visual changes, nausea, vomiting, diarrhea, constipation, dizziness, abdominal pain, skin rash, fevers, chills, night sweats, weight loss, swollen lymph nodes, body aches, joint swelling, chest pain, shortness of breath, mood changes. POSITIVE muscle aches  Objective  Blood pressure 120/82, pulse 85, height 5\' 1"  (1.549 m), weight 150 lb (68 kg), SpO2 97%.  General: No apparent distress alert and oriented x3 mood and affect normal, dressed appropriately.  HEENT: Pupils equal, extraocular movements intact  Respiratory: Patient's speak in full sentences and does not appear short of breath  Cardiovascular: No lower extremity edema, non tender, no erythema  Significant swelling noted of the MCP second and third finger and right greater than left.  Mild warmness to touch.  Patient does have some mild limited range of motion.  Patient has other joints have some arthritic changes but  mild overall.  Does walk with some weakness to the hip girdle.  Limited muscular skeletal ultrasound was performed and interpreted by Antoine Primas, M  Limited ultrasound shows that there is double lying in the second MCP that could be consistent with potentially uric acid deposits.  Hypoechoic changes noted as well. Impression Questionable synovitis or uric acid deposits.    Impression and Recommendations:     The above documentation has been reviewed and is accurate and complete Judi Saa, DO

## 2022-12-08 ENCOUNTER — Ambulatory Visit: Payer: Medicare Other | Admitting: Family Medicine

## 2022-12-08 ENCOUNTER — Encounter: Payer: Self-pay | Admitting: Family Medicine

## 2022-12-08 VITALS — BP 120/82 | HR 85 | Ht 61.0 in | Wt 150.0 lb

## 2022-12-08 DIAGNOSIS — M791 Myalgia, unspecified site: Secondary | ICD-10-CM | POA: Diagnosis not present

## 2022-12-08 DIAGNOSIS — E559 Vitamin D deficiency, unspecified: Secondary | ICD-10-CM

## 2022-12-08 DIAGNOSIS — J45909 Unspecified asthma, uncomplicated: Secondary | ICD-10-CM | POA: Insufficient documentation

## 2022-12-08 DIAGNOSIS — M2550 Pain in unspecified joint: Secondary | ICD-10-CM | POA: Diagnosis not present

## 2022-12-08 DIAGNOSIS — M659 Unspecified synovitis and tenosynovitis, unspecified site: Secondary | ICD-10-CM | POA: Diagnosis not present

## 2022-12-08 LAB — COMPREHENSIVE METABOLIC PANEL
ALT: 14 U/L (ref 0–35)
AST: 20 U/L (ref 0–37)
Albumin: 4.3 g/dL (ref 3.5–5.2)
Alkaline Phosphatase: 93 U/L (ref 39–117)
BUN: 11 mg/dL (ref 6–23)
CO2: 29 meq/L (ref 19–32)
Calcium: 9.8 mg/dL (ref 8.4–10.5)
Chloride: 104 meq/L (ref 96–112)
Creatinine, Ser: 0.67 mg/dL (ref 0.40–1.20)
GFR: 89.91 mL/min (ref >60.00)
Glucose, Bld: 81 mg/dL (ref 70–99)
Potassium: 3.8 meq/L (ref 3.5–5.1)
Sodium: 140 meq/L (ref 135–145)
Total Bilirubin: 0.7 mg/dL (ref 0.2–1.2)
Total Protein: 7.1 g/dL (ref 6.0–8.3)

## 2022-12-08 LAB — CBC WITH DIFFERENTIAL/PLATELET
Basophils Absolute: 0 10*3/uL (ref 0.0–0.1)
Basophils Relative: 0.6 % (ref 0.0–3.0)
Eosinophils Absolute: 0.1 10*3/uL (ref 0.0–0.7)
Eosinophils Relative: 1.3 % (ref 0.0–5.0)
HCT: 42.9 % (ref 36.0–46.0)
Hemoglobin: 14 g/dL (ref 12.0–15.0)
Lymphocytes Relative: 31.1 % (ref 12.0–46.0)
Lymphs Abs: 1.9 10*3/uL (ref 0.7–4.0)
MCHC: 32.6 g/dL (ref 30.0–36.0)
MCV: 96.7 fL (ref 78.0–100.0)
Monocytes Absolute: 0.5 10*3/uL (ref 0.1–1.0)
Monocytes Relative: 8.9 % (ref 3.0–12.0)
Neutro Abs: 3.5 10*3/uL (ref 1.4–7.7)
Neutrophils Relative %: 58.1 % (ref 43.0–77.0)
Platelets: 277 10*3/uL (ref 150.0–400.0)
RBC: 4.43 Mil/uL (ref 3.87–5.11)
RDW: 12.8 % (ref 11.5–15.5)
WBC: 6 10*3/uL (ref 4.0–10.5)

## 2022-12-08 LAB — C-REACTIVE PROTEIN: CRP: <1 mg/dL (ref 0.5–20.0)

## 2022-12-08 LAB — IBC PANEL
Iron: 85 ug/dL (ref 42–145)
Saturation Ratios: 26.3 % (ref 20.0–50.0)
TIBC: 323.4 ug/dL (ref 250.0–450.0)
Transferrin: 231 mg/dL (ref 212.0–360.0)

## 2022-12-08 LAB — VITAMIN B12: Vitamin B-12: 211 pg/mL (ref 211–911)

## 2022-12-08 LAB — URIC ACID: Uric Acid, Serum: 4.8 mg/dL (ref 2.4–7.0)

## 2022-12-08 LAB — VITAMIN D 25 HYDROXY (VIT D DEFICIENCY, FRACTURES): VITD: 64.98 ng/mL (ref 30.00–100.00)

## 2022-12-08 LAB — SEDIMENTATION RATE: Sed Rate: 14 mm/h (ref 0–30)

## 2022-12-08 LAB — TSH: TSH: 1.4 u[IU]/mL (ref 0.35–5.50)

## 2022-12-08 LAB — FERRITIN: Ferritin: 45.2 ng/mL (ref 10.0–291.0)

## 2022-12-08 NOTE — Patient Instructions (Addendum)
Labs today 2400mg  Tart Cherry See you again in 2 months

## 2022-12-08 NOTE — Assessment & Plan Note (Signed)
Synovitis noted.  Discussed icing regimen and home exercises.  Discussed laboratory workup for the possibility of uric acid activity contributing to some of the pain.  We discussed we could consider other treatment options such as referrals to rheumatology but I would like to see what patient's laboratory findings and spine first and then discussed thereafter.  I patient will follow-up with me again in 2 months otherwise.

## 2022-12-10 LAB — IGG, IGA, IGM
IgG (Immunoglobin G), Serum: 938 mg/dL (ref 600–1540)
IgM, Serum: 81 mg/dL (ref 50–300)
Immunoglobulin A: 156 mg/dL (ref 70–320)

## 2022-12-10 LAB — PROTEIN ELECTROPHORESIS, SERUM
Albumin ELP: 4.3 g/dL (ref 3.8–4.8)
Alpha 1: 0.3 g/dL (ref 0.2–0.3)
Alpha 2: 0.5 g/dL (ref 0.5–0.9)
Beta 2: 0.3 g/dL (ref 0.2–0.5)
Beta Globulin: 0.4 g/dL (ref 0.4–0.6)
Gamma Globulin: 0.8 g/dL (ref 0.8–1.7)
Total Protein: 6.6 g/dL (ref 6.1–8.1)

## 2022-12-10 LAB — PTH, INTACT AND CALCIUM
Calcium: 9.9 mg/dL (ref 8.6–10.4)
PTH: 38 pg/mL (ref 16–77)

## 2022-12-10 LAB — ANA: Anti Nuclear Antibody (ANA): NEGATIVE

## 2022-12-10 LAB — ANGIOTENSIN CONVERTING ENZYME: Angiotensin-Converting Enzyme: 27 U/L (ref 9–67)

## 2022-12-10 LAB — CYCLIC CITRUL PEPTIDE ANTIBODY, IGG: Cyclic Citrullin Peptide Ab: <16 U

## 2022-12-10 LAB — RHEUMATOID FACTOR: Rheumatoid fact SerPl-aCnc: <10 [IU]/mL (ref <14)

## 2022-12-10 LAB — CALCIUM, IONIZED: Calcium, Ion: 5.1 mg/dL (ref 4.7–5.5)

## 2023-02-15 NOTE — Progress Notes (Signed)
 [facility] [address] [place] [address] Phone: [phone] Subjective:   Lisa Contreras, am serving as a scribe for Dr. Arthea Contreras.  I'm seeing this patient by the request  of:  Lisa Almarie SAUNDERS, MD  CC: Hand pain and swelling follow-up  YEP:Dlagzrupcz  12/08/2022 Synovitis noted.  Discussed icing regimen and home exercises.  Discussed laboratory workup for the possibility of uric acid activity contributing to some of the pain.  We discussed we could consider other treatment options such as referrals to rheumatology but I would like to see what patient's laboratory findings and spine first and then discussed thereafter.  I patient will follow-up with me again in 2 months otherwise.   Update 02/17/2023 Lisa Contreras is a 68 y.o. female coming in with complaint of arthritis. Patient states that she has been taking tart cherry but it is making her constipated. Does feel like it is helpful to lessen her pain. Has been making dietary changes as well. Also started B6 supplement.   Wonders if cysts on her hands are worsening. L hand DIP, ring finger and R hand, multiple fingers with cysts.        No past medical history on file. No past surgical history on file. Social History   Socioeconomic History   Marital status: Married    Spouse name: Not on file   Number of children: Not on file   Years of education: Not on file   Highest education level: Not on file  Occupational History   Not on file  Tobacco Use   Smoking status: Not on file   Smokeless tobacco: Not on file  Substance and Sexual Activity   Alcohol use: Not on file   Drug use: Not on file   Sexual activity: Not on file  Other Topics Concern   Not on file  Social History Narrative   Not on file   Social Drivers of Health   Financial Resource Strain: Not on file  Food Insecurity: Not on file  Transportation Needs: Not on file  Physical Activity: Not on file   Stress: Not on file  Social Connections: Unknown (06/18/2021)   Received from [facility], [facility]   Social Network    Social Network: Not on file   Allergies  Allergen Reactions   Aspirin Anaphylaxis, Hives, Itching, Shortness Of Breath and Other (See Comments)   Elemental Sulfur Anaphylaxis, Hives, Itching, Rash, Shortness Of Breath and Swelling   Penicillins Anaphylaxis, Hives, Itching, Nausea And Vomiting, Rash, Shortness Of Breath and Other (See Comments)   Sulfa Antibiotics Anaphylaxis   Tinidazole Other (See Comments)   Chocolate Other (See Comments)   Codeine Itching and Other (See Comments)   Other Other (See Comments)   Vitamin C Other (See Comments)   Pseudoephedrine-Codeine-Gg Hives, Itching and Palpitations   Family History  Problem Relation Age of Onset   Breast cancer Paternal Aunt    Breast cancer Paternal Grandmother     Current Outpatient Medications (Endocrine & Metabolic):    predniSONE  (DELTASONE ) 20 MG tablet, Take 1 tablet (20 mg total) by mouth 2 (two) times daily.    Current Outpatient Medications (Analgesics):    allopurinol  (ZYLOPRIM ) 100 MG tablet, Take 2 tablets (200 mg total) by mouth daily.     Reviewed prior external information including notes and imaging from  primary care provider As well as notes that were available from care everywhere and other healthcare systems.  Past medical  history, social, surgical and family history all reviewed in electronic medical record.  No pertanent information unless stated regarding to the chief complaint.   Review of Systems:  No headache, visual changes, nausea, vomiting, diarrhea, constipation, dizziness, abdominal pain, skin rash, fevers, chills, night sweats, weight loss, swollen lymph nodes, b chest pain, shortness of breath, mood changes. POSITIVE muscle aches, joint swelling and body aches  Objective  Blood pressure 122/76, height 5' 1 (1.549 m), weight 150 lb (68 kg).   General: No  apparent distress alert and oriented x3 mood and affect normal, dressed appropriately.  HEENT: Pupils equal, extraocular movements intact  Respiratory: Patient's speak in full sentences and does not appear short of breath  Cardiovascular: No lower extremity edema, non tender, no erythema  Sitting comfortably.  Still has the cyst formations noted over the DIP of the index finger on the right hand.  Still has some thickening of the synovium over the second and third metacarpals bilaterally.  No erythema though noted neurovascularly intact distally.    Impression and Recommendations:    The above documentation has been reviewed and is accurate and complete [name], DO

## 2023-02-17 ENCOUNTER — Ambulatory Visit (INDEPENDENT_AMBULATORY_CARE_PROVIDER_SITE_OTHER): Payer: Medicare Other | Admitting: Family Medicine

## 2023-02-17 ENCOUNTER — Encounter: Payer: Self-pay | Admitting: Family Medicine

## 2023-02-17 VITALS — BP 122/76 | Ht 61.0 in | Wt 150.0 lb

## 2023-02-17 DIAGNOSIS — M659 Unspecified synovitis and tenosynovitis, unspecified site: Secondary | ICD-10-CM

## 2023-02-17 DIAGNOSIS — E538 Deficiency of other specified B group vitamins: Secondary | ICD-10-CM | POA: Diagnosis not present

## 2023-02-17 MED ORDER — PREDNISONE 20 MG PO TABS: 20.0000 mg | ORAL_TABLET | Freq: Two times a day (BID) | ORAL | 0 refills | Status: DC

## 2023-02-17 MED ORDER — CYANOCOBALAMIN 1000 MCG/ML IJ SOLN
1000.0000 ug | Freq: Once | INTRAMUSCULAR | Status: AC
  Administered 2023-02-17: 1000 ug via INTRAMUSCULAR

## 2023-02-17 MED ORDER — ALLOPURINOL 100 MG PO TABS: 200.0000 mg | ORAL_TABLET | Freq: Every day | ORAL | 0 refills | Status: DC

## 2023-02-17 NOTE — Assessment & Plan Note (Addendum)
 Continues to have intermittent cellulitis in the hands.  At the moment no significant erythema but still has some thickening of the synovial lining of the second and third metacarpals.  Cyst formations over the DIPs.  Has had multiple cyst previously.  Discussed icing regimen and home exercises, discussed which activities to do and which ones to avoid.  Did start allopurinol  today to see if this would help with patient having a good response to the tart cherry.  B12 injection given today secondary to the decrease to see how patient responds to that as well.  We did discuss such and things as referral to rheumatology but with laboratory workup do not think it would change medical management at the moment.  Total time reviewing patient's discussed with patient 36 minutes

## 2023-02-17 NOTE — Assessment & Plan Note (Signed)
 Found to have significant B12 deficiency.  Continue oral supplementation but given injection today as well.  Past medical history significant for alcoholism but is in remission.

## 2023-02-17 NOTE — Patient Instructions (Signed)
 Good to see you  Allopurinol 200mg  daily Prednisone 20mg  next 4 days B12 injection today  Send message in 10 days See me again in 2 months

## 2023-02-21 ENCOUNTER — Telehealth: Payer: Self-pay | Admitting: Family Medicine

## 2023-02-21 NOTE — Telephone Encounter (Signed)
 Pt seen 02/17/2023 and started allopurinal and prednisone. Feels like she is having a reaction, swelling and itching of both hands and feet.  Took last Prednisone on Saturday and last Allopurinal yesterday. Unsure how to proceed.

## 2023-02-22 LAB — HEPATIC FUNCTION PANEL
ALT: 17 U/L (ref 7–35)
AST: 16 (ref 13–35)
Bilirubin, Total: 0.5

## 2023-02-22 LAB — COMPREHENSIVE METABOLIC PANEL
Albumin: 4.4 (ref 3.5–5.0)
Calcium: 10 (ref 8.7–10.7)
eGFR: 94

## 2023-02-22 LAB — CBC AND DIFFERENTIAL
HCT: 46 (ref 36–46)
Hemoglobin: 14.8 (ref 12.0–16.0)
Platelets: 338 10*3/uL (ref 150–400)
WBC: 4.4

## 2023-02-22 LAB — LIPID PANEL
Cholesterol: 234 — AB (ref 0–200)
HDL: 67 (ref 35–70)
LDL Cholesterol: 141
Triglycerides: 134 (ref 40–160)

## 2023-02-22 LAB — BASIC METABOLIC PANEL
BUN: 11 (ref 4–21)
CO2: 30 — AB (ref 13–22)
Chloride: 103 (ref 99–108)
Creatinine: 0.7 (ref 0.5–1.1)
Glucose: 91
Potassium: 4.4 meq/L (ref 3.5–5.1)
Sodium: 138 (ref 137–147)

## 2023-02-22 LAB — VITAMIN D 25 HYDROXY (VIT D DEFICIENCY, FRACTURES): Vit D, 25-Hydroxy: 59

## 2023-02-22 LAB — TSH: TSH: 1.85 (ref 0.41–5.90)

## 2023-02-27 ENCOUNTER — Telehealth: Payer: Self-pay | Admitting: Family Medicine

## 2023-02-27 ENCOUNTER — Encounter: Payer: Self-pay | Admitting: Family Medicine

## 2023-02-27 NOTE — Telephone Encounter (Signed)
Copied from CRM [id number]. 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 Durward Mallard at another practice. Patient is aware Dr. Durward Mallard is not accepting new patient but still wanted to reach out and see if she would possibly take her and her husband back as patients again.   PLEASE ADVISE

## 2023-03-11 ENCOUNTER — Other Ambulatory Visit: Payer: Self-pay | Admitting: Family Medicine

## 2023-03-13 LAB — VITAMIN B12: Vitamin B-12: 293

## 2023-03-14 LAB — LAB REPORT - SCANNED: EGFR: 97

## 2023-04-05 ENCOUNTER — Ambulatory Visit (INDEPENDENT_AMBULATORY_CARE_PROVIDER_SITE_OTHER): Payer: Medicare Other | Admitting: Family Medicine

## 2023-04-05 ENCOUNTER — Encounter: Payer: Self-pay | Admitting: Family Medicine

## 2023-04-05 VITALS — BP 120/72 | HR 88 | Temp 97.7°F | Ht 61.0 in | Wt 141.0 lb

## 2023-04-05 DIAGNOSIS — M109 Gout, unspecified: Secondary | ICD-10-CM

## 2023-04-05 DIAGNOSIS — J309 Allergic rhinitis, unspecified: Secondary | ICD-10-CM | POA: Diagnosis not present

## 2023-04-05 DIAGNOSIS — J4520 Mild intermittent asthma, uncomplicated: Secondary | ICD-10-CM

## 2023-04-05 DIAGNOSIS — H35413 Lattice degeneration of retina, bilateral: Secondary | ICD-10-CM

## 2023-04-05 DIAGNOSIS — Z780 Asymptomatic menopausal state: Secondary | ICD-10-CM | POA: Insufficient documentation

## 2023-04-05 DIAGNOSIS — B3732 Chronic candidiasis of vulva and vagina: Secondary | ICD-10-CM

## 2023-04-05 DIAGNOSIS — E538 Deficiency of other specified B group vitamins: Secondary | ICD-10-CM | POA: Diagnosis not present

## 2023-04-05 DIAGNOSIS — M8580 Other specified disorders of bone density and structure, unspecified site: Secondary | ICD-10-CM

## 2023-04-05 DIAGNOSIS — M722 Plantar fascial fibromatosis: Secondary | ICD-10-CM | POA: Insufficient documentation

## 2023-04-05 DIAGNOSIS — M659 Unspecified synovitis and tenosynovitis, unspecified site: Secondary | ICD-10-CM

## 2023-04-05 NOTE — Patient Instructions (Addendum)
 It was so good seeing you again! Thank you for establishing with my new practice and allowing me to continue caring for you. It means a lot to me.   Please schedule a follow up appointment with me in January 2025 for your annual complete physical; please come fasting.   VISIT SUMMARY:  Today, we reviewed your overall health and discussed several ongoing health issues, including your cholesterol, gouty arthritis, vitamin B12 levels, bone density, asthma, and chronic vaginitis. We also talked about your general health maintenance and follow-up plans.  YOUR PLAN:  -HYPERLIPIDEMIA: Hyperlipidemia means having high levels of fats (lipids) in your blood, which can increase your risk of heart disease. Your cholesterol is not significantly elevated, but we discussed your atherosclerotic cardiovascular disease risk score and the factors that contribute to it. Your score is borderline elevated but not yet at the point of requiring lipid lowering medication.  The 10-year ASCVD risk score (Arnett DK, et al., 2019) is: 6.8%*   Values used to calculate the score:     Age: 68 years     Sex: Female     Is Non-Hispanic African American: No     Diabetic: No     Tobacco smoker: No     Systolic Blood Pressure: 120 mmHg     Is BP treated: No     HDL Cholesterol: 67 mg/dL*     Total Cholesterol: 234 mg/dL*     * - Cholesterol units were assumed for this score calculation  -GOUTY ARTHRITIS: Gouty arthritis is a type of arthritis caused by the buildup of uric acid crystals in the joints, leading to pain and swelling. We noted that your uric acid levels have improved, and we will consider discussing alternative medications to prevent gout attacks.  -VITAMIN B12 DEFICIENCY: Vitamin B12 deficiency means having low levels of vitamin B12, which is important for nerve function and the production of red blood cells. You should take B12 supplements (1000 mcg daily) and we will recheck your levels in the  future.  -OSTEOPENIA: Osteopenia is a condition where bone density is lower than normal, which can lead to an increased risk of fractures. You should continue taking Vitamin D and calcium supplements, and we will recheck your bone density later this year.  -ASTHMA: Asthma is a condition where your airways narrow and swell, making it difficult to breathe. Your asthma is well-controlled with minimal use of an inhaler, so you should continue your current management.  -CHRONIC VAGINITIS: Chronic vaginitis is a long-term inflammation of the vagina, often causing discomfort. You should continue using Intrarosa daily as it helps manage your symptoms.  -GENERAL HEALTH MAINTENANCE: Your colonoscopy is up to date, so no stool tests are needed at this time. Continue with Weight Watchers and regular exercise for weight management. We will plan for your annual physical in January.  INSTRUCTIONS:  Please take B12 supplements (1000 mcg daily) and we will recheck your levels in the future. We will also recheck your bone density later this year. Continue with Weight Watchers and regular exercise for weight management. Plan for your annual physical in January.

## 2023-04-05 NOTE — Progress Notes (Signed)
 Subjective  CC:  Chief Complaint  Patient presents with   Establish Care    HPI: Lisa Contreras is a 68 y.o. female who presents to [facility] [place] today to establish care with me as a new patient.  Former patient of mine from [facility]; I reviewed my notes. Since has been to Dr. Duanne Guess; brings in labs from recent cpe 02/2023. Sees GI and SM  She has the following concerns or needs: 68 year old female, healthy with no major chronic conditions.  Has recently been seeing sports medicine for synovitis, possibly gout related.  I reviewed multiple records.  She has seen a hand surgeon to have cyst removed.  Inflammation is improving.  She failed a round of allopurinol due to an allergy.  She is sensitive to multiple medications.  Rheumatology panel was negative for inflammatory conditions.  She says. She had a physical with her former primary care MD and she brings in lab work.  We went over all of her lab results in detail.  She has borderline hypercholesterolemia but has not yet meeting indications for medications.  She is skeptical of medications because she has a hard time tolerating most medications.  She does eat a healthy diet.  She does exercise.  She has a family history of heart disease but not premature heart disease.  She does not suffer from hypertension or diabetes.  She is a non-smoker She has very mild intermittent asthma and rarely uses her inhaler. Recently diagnosed with vitamin B12 deficiency and just started oral supplements. She has chronic allergies which are controlled. 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  Fortunately her daughter does not have it. She has chronic and recurrent yeast vaginitis.  Her GYN manages this with intrarossa; working well.  Health maintenance records are all reviewed.  She had 2 normal colonoscopies most recent was 7 years ago.  Her mammogram is current.  She had bone density in 2023  showing mild osteopenia.  She would prefer to recheck this next year.  She takes vitamin D but not calcium.  No longer needs Pap smears status post hysterectomy and is postmenopausal.  All immunizations are up-to-date.  I reviewed her immunization report and abstracted data. 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  She is married.  She is happy, 1 grown daughter who recently married and lives in [place].  Non-smoker.  Nondrinker  Assessment  1. Osteopenia after menopause   2. Mild intermittent asthma without complication   3. B12 deficiency   4. Chronic allergic rhinitis   5. Bilateral retinal lattice degeneration   6. Synovitis   7. Gout, arthritis   8. Chronic candidiasis of vulva and vagina      Plan  Osteopenia: Recommend adding calcium 600 twice daily to vitamin D supplementation.  Her vitamin D level was recently normal.  Continue exercise and weightbearing.  We will recheck a bone density next year Mild intermittent mitten asthma is uncomplicated and controlled with very rare albuterol use Start B12 oral supplements daily.  Will recheck. Gout with significant synovitis.  Improving.  Secondary cyst.  Uric acid level was recently checked and was at 4.8.  Continue supportive care. Sees an eye doctor every 6 months.  Vision is stable Chronic allergies are stable. Counseled regarding cholesterol levels.  Her current atherosclerotic risk score is 6.8%.  Will monitor over time.  No medications recommended today. Continue intrarossa  Follow  up: January 2026 for complete physical Orders Placed This Encounter  Procedures   HM HEPATITIS C SCREENING LAB   CBC and differential   VITAMIN D 25 Hydroxy (Vit-D Deficiency, Fractures)   Basic metabolic panel   Comprehensive metabolic panel   Lipid panel   Hepatic function panel   Vitamin B12   TSH   HM COLONOSCOPY   No orders of the defined types were placed in this encounter.       04/05/2023    8:53 AM   Depression screen PHQ 2/9  Decreased Interest 0  Down, Depressed, Hopeless 0  PHQ - 2 Score 0    We updated and reviewed the patient's past history in detail and it is documented below.  Patient Active Problem List   Diagnosis Date Noted Date Diagnosed   Osteopenia after menopause 04/05/2023     Priority: Medium     Dexa 2024: lowest T = -1.2, recheck 2-3 years     Gout, arthritis 04/05/2023     Priority: Medium    Synovitis 12/08/2022     Priority: Medium     SM: ? Gouty arthritis. Rheum panel negative. Allergic to allopurinol. Uric acid 4.8 03/2023    Mild stress incontinence 05/06/2013     Priority: Medium    Mild intermittent asthma 12/21/2009     Priority: Medium    Plantar fasciitis, bilateral 04/05/2023     Priority: Low    Adult onset around 1980    B12 deficiency 02/17/2023     Priority: Low   Chronic candidiasis of vulva and vagina 05/03/2012     Priority: Low    Chronic yeast vaginitis - uses ointment daily per GYN.    Chronic allergic rhinitis 05/03/2011     Priority: Low   Lattice degeneration 04/15/2008     Priority: Low   Health Maintenance  Topic Date Due   Medicare Annual Wellness (AWV)  09/05/2023   DEXA SCAN  02/11/2024   Pneumonia Vaccine 36+ Years old (3 of 3 - PPSV23 or PCV20) 08/12/2024   MAMMOGRAM  09/01/2024   Colonoscopy  08/08/2026   DTaP/Tdap/Td (3 - Td or Tdap) 08/08/2028   INFLUENZA VACCINE  Completed   COVID-19 Vaccine  Completed   Hepatitis C Screening  Completed   Zoster Vaccines- Shingrix  Completed   HPV VACCINES  Aged Out   Immunization History  Administered Date(s) Administered   Hepatitis A, Ped/Adol-2 Dose 02/12/2018, 08/09/2018   Influenza,inj,Quad PF,6+ Mos 11/13/2015, 09/25/2017, 09/25/2017   Influenza,trivalent, recombinat, inj, PF 10/09/2010, 10/22/2014   Influenza-Unspecified 09/22/2022   PFIZER(Purple Top)SARS-COV-2 Vaccination 04/12/2019, 05/03/2019   Pneumococcal Conjugate-13 08/09/2018   Pneumococcal  Polysaccharide-23 06/24/2008, 08/13/2019   Tdap 06/24/2008, 08/09/2018   Unspecified SARS-COV-2 Vaccination 10/07/2022   Zoster Recombinant(Shingrix) 08/09/2018, 02/13/2019   Zoster, Live 10/22/2014   Current Meds  Medication Sig   albuterol (VENTOLIN HFA) 108 (90 Base) MCG/ACT inhaler SMARTSIG:1-2 Puff(s) Via Inhaler Every 4-6 Hours PRN   fexofenadine (ALLEGRA ALLERGY) 60 MG tablet Allegra    Allergies: Patient is allergic to aspirin, elemental sulfur, penicillins, sulfa antibiotics, tinidazole, chocolate, codeine, other, vitamin c, and pseudoephedrine-codeine-gg. Past Medical History Patient  has a past medical history of Alcoholism in remission (HCC), Allergy, Arthritis, and Asthma. Past Surgical History Patient  has a past surgical history that includes Abdominal hysterectomy and Eye surgery. Family History: Patient family history includes Breast cancer in her paternal aunt and paternal grandmother. Social History:  Patient  reports that she has never smoked. She  has never used smokeless tobacco. She reports that she does not drink alcohol and does not use drugs.  Review of Systems: Constitutional: negative for fever or malaise Ophthalmic: negative for photophobia, double vision or loss of vision Cardiovascular: negative for chest pain, dyspnea on exertion, or new LE swelling Respiratory: negative for SOB or persistent cough Gastrointestinal: negative for abdominal pain, change in bowel habits or melena Genitourinary: negative for dysuria or gross hematuria Musculoskeletal: negative for new gait disturbance or muscular weakness Integumentary: negative for new or persistent rashes Neurological: negative for TIA or stroke symptoms Psychiatric: negative for SI or delusions Allergic/Immunologic: negative for hives  Patient Care Team    Relationship Specialty Notifications Start End  Willow Ora, MD PCP - General Family Medicine  04/05/23   Judi Saa, DO Consulting  Physician Sports Medicine  04/05/23   Dairl Ponder, MD Consulting Physician Orthopedic Surgery  04/05/23   [place], Amy, MD Consulting Physician Dermatology  04/05/23   Charna Elizabeth, MD Consulting Physician Gastroenterology  04/05/23     Objective  Vitals: BP 120/72 (Cuff Size: Normal) Comment: right arm lying/cla  Pulse 88   Temp 97.7 F (36.5 C)   Ht 5\' 1"  (1.549 m)   Wt 141 lb (64 kg)   SpO2 94%   BMI 26.64 kg/m  General:  Well developed, well nourished, no acute distress  Psych:  Alert and oriented,normal mood and affect HEENT:  Normocephalic, atraumatic, non-icteric sclera, supple neck without adenopathy, mass or thyromegaly Cardiovascular:  RRR without gallop, rub or murmur Respiratory:  Good breath sounds bilaterally, CTAB with normal respiratory effort Gastrointestinal: normal bowel sounds, soft, non-tender, no noted masses. No HSM MSK: no deformities, contusions. MCP bilateral hands w/ swelling  Skin:  Warm, no rashes or suspicious lesions noted Neurologic:    Mental status is normal. Gross motor and sensory exams are normal. Normal gait  Office Visit on 04/05/2023  Component Date Value Ref Range Status   HM Hepatitis Screen 05/17/2016 Negative-Validated   Final   HM Colonoscopy 08/07/2016 Patient Reported  See Report (in chart), Patient Reported Final   Hemoglobin 02/22/2023 14.8  12.0 - 16.0 Final   HCT 02/22/2023 46  36 - 46 Final   Platelets 02/22/2023 338  150 - 400 K/uL Final   WBC 02/22/2023 4.4   Final   Vit D, 25-Hydroxy 02/22/2023 59   Final   Glucose 02/22/2023 91   Final   BUN 02/22/2023 11  4 - 21 Final   CO2 02/22/2023 30 (A)  13 - 22 Final   Creatinine 02/22/2023 0.7  0.5 - 1.1 Final   Potassium 02/22/2023 4.4  3.5 - 5.1 mEq/L Final   Sodium 02/22/2023 138  137 - 147 Final   Chloride 02/22/2023 103  99 - 108 Final   eGFR 02/22/2023 94   Final   Calcium 02/22/2023 10.0  8.7 - 10.7 Final   Albumin 02/22/2023 4.4  3.5 - 5.0 Final   Triglycerides  02/22/2023 134  40 - 160 Final   Cholesterol 02/22/2023 234 (A)  0 - 200 Final   HDL 02/22/2023 67  35 - 70 Final   LDL Cholesterol 02/22/2023 141   Final   ALT 02/22/2023 17  7 - 35 U/L Final   AST 02/22/2023 16  13 - 35 Final   Bilirubin, Total 02/22/2023 0.5   Final   Vitamin B-12 03/13/2023 293   Final   TSH 02/22/2023 1.85  0.41 - 5.90 Final   The  10-year ASCVD risk score (Arnett DK, et al., 2019) is: 6.8%*   Values used to calculate the score:     Age: 19 years     Sex: Female     Is Non-Hispanic African American: No     Diabetic: No     Tobacco smoker: No     Systolic Blood Pressure: 120 mmHg     Is BP treated: No     HDL Cholesterol: 67 mg/dL*     Total Cholesterol: 234 mg/dL*     * - Cholesterol units were assumed for this score calculation  Commons side effects, risks, benefits, and alternatives for medications and treatment plan prescribed today were discussed, and the patient expressed understanding of the given instructions. Patient is instructed to call or message via MyChart if he/she has any questions or concerns regarding our treatment plan. No barriers to understanding were identified. We discussed Red Flag symptoms and signs in detail. Patient expressed understanding regarding what to do in case of urgent or emergency type symptoms.  Medication list was reconciled, printed and provided to the patient in AVS. Patient instructions and summary information was reviewed with the patient as documented in the AVS. This note was prepared with assistance of Dragon voice recognition software. Occasional wrong-word or sound-a-like substitutions may have occurred due to the inherent limitations of voice recognition software  This visit occurred during the SARS-CoV-2 public health emergency.  Safety protocols were in place, including screening questions prior to the visit, additional usage of staff PPE, and extensive cleaning of exam room while observing appropriate contact time as  indicated for disinfecting solutions.

## 2023-04-18 NOTE — Progress Notes (Unsigned)
 [facility] [address] [place] [address] Phone: [phone] Subjective:   Lisa Contreras, am serving as a scribe for Dr. Antoine Primas.  I'm seeing this patient by the request  of:  Willow Ora, MD  CC: Bile hand pain follow-up  [id number]  02/17/2023 Continues to have intermittent cellulitis in the hands.  At the moment no significant erythema but still has some thickening of the synovial lining of the second and third metacarpals.  Cyst formations over the DIPs.  Has had multiple cyst previously.  Discussed icing regimen and home exercises, discussed which activities to do and which ones to avoid.  Did start allopurinol today to see if this would help with patient having a good response to the tart cherry.  B12 injection given today secondary to the decrease to see how patient responds to that as well.  We did discuss such and things as referral to rheumatology but with laboratory workup do not think it would change medical management at the moment.  Total time reviewing patient's discussed with patient 36 minutes     Found to have significant B12 deficiency.  Continue oral supplementation but given injection today as well.  Past medical history significant for alcoholism but is in remission.      Update 04/20/2023 Lisa Contreras is a 68 y.o. female coming in with complaint of B hand pain. Patient states that she is taking tart cherry 2400mg . Take B complex vitamin and Vit D. Wants to know if going on Calcium would be ok with hx of gout. Hand still swells at times. Worse with barometric pressure changes.        Past Medical History:  Diagnosis Date   Alcoholism in remission (HCC)    quit 1990, approx   Allergy    Arthritis    Asthma    Past Surgical History:  Procedure Laterality Date   ABDOMINAL HYSTERECTOMY     EYE SURGERY     Social History   Socioeconomic History   Marital status: Married    Spouse name: Not on file    Number of children: Not on file   Years of education: Not on file   Highest education level: Master's degree (e.g., MA, MS, MEng, MEd, MSW, MBA)  Occupational History   Not on file  Tobacco Use   Smoking status: Never   Smokeless tobacco: Never  Vaping Use   Vaping status: Never Used  Substance and Sexual Activity   Alcohol use: Never   Drug use: Never   Sexual activity: Yes    Birth control/protection: Post-menopausal  Other Topics Concern   Not on file  Social History Narrative   Not on file   Social Drivers of Health   Financial Resource Strain: Low Risk  (04/01/2023)   Overall Financial Resource Strain (CARDIA)    Difficulty of Paying Living Expenses: Not very hard  Food Insecurity: No Food Insecurity (04/01/2023)   Hunger Vital Sign    Worried About Running Out of Food in the Last Year: Never true    Ran Out of Food in the Last Year: Never true  Transportation Needs: No Transportation Needs (04/01/2023)   PRAPARE - Transportation    Lack of Transportation (Medical): No    Lack of Transportation (Non-Medical): No  Physical Activity: Sufficiently Active (04/01/2023)   Exercise Vital Sign    Days of Exercise per Week: 5 days    Minutes of Exercise per Session: 60 min  Stress: No Stress Concern Present (04/01/2023)   [employer] of Occupational Health - Occupational Stress Questionnaire    Feeling of Stress : Not at all  Social Connections: Socially Integrated (04/01/2023)   Social Connection and Isolation Panel [NHANES]    Frequency of Communication with Friends and Family: More than three times a week    Frequency of Social Gatherings with Friends and Family: More than three times a week    Attends Religious Services: More than 4 times per year    Active Member of [employer] or Organizations: Yes    Attends Engineer, structural: More than 4 times per year    Marital Status: Married   Allergies  Allergen Reactions   Aspirin Anaphylaxis, Hives, Itching,  Shortness Of Breath and Other (See Comments)   Elemental Sulfur Anaphylaxis, Hives, Itching, Rash, Shortness Of Breath and Swelling   Penicillins Anaphylaxis, Hives, Itching, Nausea And Vomiting, Rash, Shortness Of Breath and Other (See Comments)   Sulfa Antibiotics Anaphylaxis   Tinidazole Other (See Comments)   Chocolate Other (See Comments)   Codeine Itching and Other (See Comments)   Other Other (See Comments)   Vitamin C Other (See Comments)   Pseudoephedrine-Codeine-Gg Hives, Itching and Palpitations   Family History  Problem Relation Age of Onset   Breast cancer Paternal Aunt    Breast cancer Paternal Grandmother       Current Outpatient Medications (Respiratory):    albuterol (VENTOLIN HFA) 108 (90 Base) MCG/ACT inhaler, SMARTSIG:1-2 Puff(s) Via Inhaler Every 4-6 Hours PRN   fexofenadine (ALLEGRA ALLERGY) 60 MG tablet, Allegra    Current Outpatient Medications (Other):    Prasterone (INTRAROSA) 6.5 MG INST, Place vaginally.   Reviewed prior external information including notes and imaging from  primary care provider As well as notes that were available from care everywhere and other healthcare systems.  Past medical history, social, surgical and family history all reviewed in electronic medical record.  No pertanent information unless stated regarding to the chief complaint.   Review of Systems:  No headache, visual changes, nausea, vomiting, diarrhea, constipation, dizziness, abdominal pain, skin rash, fevers, chills, night sweats, weight loss, swollen lymph nodes, body aches, joint swelling, chest pain, shortness of breath, mood changes. POSITIVE muscle aches  Objective  Blood pressure 128/84, pulse 84, height 5\' 1"  (1.549 m), weight 139 lb (63 kg), SpO2 92%.   General: No apparent distress alert and oriented x3 mood and affect normal, dressed appropriately.  Moderately anxious HEENT: Pupils equal, extraocular movements intact  Respiratory: Patient's speak in full  sentences and does not appear short of breath  Cardiovascular: No lower extremity edema, non tender, no erythema  Skin exam Significant decrease in the amount of synovitis noted in the MCP areas. Improvement range of motion    Impression and Recommendations:    The above documentation has been reviewed and is accurate and complete Judi Saa, DO

## 2023-04-20 ENCOUNTER — Ambulatory Visit: Payer: Medicare Other | Admitting: Family Medicine

## 2023-04-20 ENCOUNTER — Other Ambulatory Visit: Payer: Self-pay

## 2023-04-20 VITALS — BP 128/84 | HR 84 | Ht 61.0 in | Wt 139.0 lb

## 2023-04-20 DIAGNOSIS — M79642 Pain in left hand: Secondary | ICD-10-CM

## 2023-04-20 DIAGNOSIS — M659 Unspecified synovitis and tenosynovitis, unspecified site: Secondary | ICD-10-CM | POA: Diagnosis not present

## 2023-04-20 DIAGNOSIS — M79641 Pain in right hand: Secondary | ICD-10-CM

## 2023-04-20 NOTE — Patient Instructions (Signed)
 3600mg  of tart cherry at bedtime I will look into clove soak See me in 3-4 months

## 2023-04-20 NOTE — Assessment & Plan Note (Signed)
 Continuing to work on the uric acid.  Increase tart cherry again.  Hopefully that this is more hopeful.  Discussed icing regimen, at this moment I think patient is doing better than she has done in a year or so.  Will continue to monitor and follow-up again in 3 months.  Did discuss different things such as her other home modalities as well as calcium supplementation.

## 2023-07-19 NOTE — Progress Notes (Signed)
 [facility] [address] [place] [address] Phone: [phone] Subjective:   Delwyn Filippo, am serving as a scribe for Dr. Ronnell Coins.  I'm seeing this patient by the request  of:  Luevenia Saha, MD  CC: Bilateral hand pain  [id number]  LEISHA TRINKLE is a 68 y.o. female coming in with complaint of B hand pain. Last seen on 04/20/2023.  Found to have more of a synovitis and was concern for the uric acid.  Was to increase supplementations.  Patient states that she has tingling and numbness on and off. Overall much better.   Cyst on R thumb IP joint. Now taking 3400mg  of tart cherry at night which has been helpful.   Also taking B12 and B6. Should she continue?   Laboratory workup did show that patient did have a low vitamin B12.  Past Medical History:  Diagnosis Date   Alcoholism in remission (HCC)    quit 1990, approx   Allergy    Arthritis    Asthma    Past Surgical History:  Procedure Laterality Date   ABDOMINAL HYSTERECTOMY     EYE SURGERY     Social History   Socioeconomic History   Marital status: Married    Spouse name: Not on file   Number of children: Not on file   Years of education: Not on file   Highest education level: Master's degree (e.g., MA, MS, MEng, MEd, MSW, MBA)  Occupational History   Not on file  Tobacco Use   Smoking status: Never   Smokeless tobacco: Never  Vaping Use   Vaping status: Never Used  Substance and Sexual Activity   Alcohol use: Never   Drug use: Never   Sexual activity: Yes    Birth control/protection: Post-menopausal  Other Topics Concern   Not on file  Social History Narrative   Not on file   Social Drivers of Health   Financial Resource Strain: Low Risk  (04/01/2023)   Overall Financial Resource Strain (CARDIA)    Difficulty of Paying Living Expenses: Not very hard  Food Insecurity: No Food Insecurity (04/01/2023)   Hunger Vital Sign    Worried About Running Out of  Food in the Last Year: Never true    Ran Out of Food in the Last Year: Never true  Transportation Needs: No Transportation Needs (04/01/2023)   PRAPARE - Transportation    Lack of Transportation (Medical): No    Lack of Transportation (Non-Medical): No  Physical Activity: Sufficiently Active (04/01/2023)   Exercise Vital Sign    Days of Exercise per Week: 5 days    Minutes of Exercise per Session: 60 min  Stress: No Stress Concern Present (04/01/2023)   [employer] of Occupational Health - Occupational Stress Questionnaire    Feeling of Stress : Not at all  Social Connections: Socially Integrated (04/01/2023)   Social Connection and Isolation Panel    Frequency of Communication with Friends and Family: More than three times a week    Frequency of Social Gatherings with Friends and Family: More than three times a week    Attends Religious Services: More than 4 times per year    Active Member of [employer] or Organizations: Yes    Attends Engineer, structural: More than 4 times per year    Marital Status: Married   Allergies  Allergen Reactions   Aspirin Anaphylaxis, Hives, Itching, Shortness Of Breath and Other (See Comments)   Elemental  Sulfur Anaphylaxis, Hives, Itching, Rash, Shortness Of Breath and Swelling   Penicillins Anaphylaxis, Hives, Itching, Nausea And Vomiting, Rash, Shortness Of Breath and Other (See Comments)   Sulfa Antibiotics Anaphylaxis   Tinidazole Other (See Comments)   Chocolate Other (See Comments)   Codeine Itching and Other (See Comments)   Other Other (See Comments)   Vitamin C Other (See Comments)   Pseudoephedrine-Codeine-Gg Hives, Itching and Palpitations   Family History  Problem Relation Age of Onset   Breast cancer Paternal Aunt    Breast cancer Paternal Grandmother       Current Outpatient Medications (Respiratory):    albuterol (VENTOLIN HFA) 108 (90 Base) MCG/ACT inhaler, SMARTSIG:1-2 Puff(s) Via Inhaler Every 4-6 Hours PRN    fexofenadine (ALLEGRA ALLERGY) 60 MG tablet, Allegra    Current Outpatient Medications (Other):    Prasterone (INTRAROSA) 6.5 MG INST, Place vaginally.   Reviewed prior external information including notes and imaging from  primary care provider As well as notes that were available from care everywhere and other healthcare systems.  Past medical history, social, surgical and family history all reviewed in electronic medical record.  No pertanent information unless stated regarding to the chief complaint.   Review of Systems:  No headache, visual changes, nausea, vomiting, diarrhea, constipation, dizziness, abdominal pain, skin rash, fevers, chills, night sweats, weight loss, swollen lymph nodes, body aches, joint swelling, chest pain, shortness of breath, mood changes. POSITIVE muscle aches  Objective  Blood pressure 124/88, pulse 83, height 5' 1 (1.549 m), weight 138 lb (62.6 kg), SpO2 99%.   General: No apparent distress alert and oriented x3 mood and affect normal, dressed appropriately.  HEENT: Pupils equal, extraocular movements intact  Respiratory: Patient's speak in full sentences and does not appear short of breath  Cardiovascular: No lower extremity edema, non tender, no erythema  Hand exam on the right side still shows some synovitis noted of the 2nd and 3rd MCPs on the right hand.  Patient's IP does have cystic formation noted over the ulnar aspect on the dorsal aspect of the thumb.  No erythema no warmness to touch.  Limited muscular skeletal ultrasound was performed and interpreted by Ronnell Coins, M  Limited ultrasound still shows that patient does have some synovitis noted with improvement in some of the calcific and likely uric acid changes noted. Impression: Interval improvement but mild to moderate   Impression and Recommendations:     The above documentation has been reviewed and is accurate and complete Iveliz Garay M Josaiah Muhammed, DO

## 2023-07-20 ENCOUNTER — Other Ambulatory Visit: Payer: Self-pay | Admitting: Family Medicine

## 2023-07-20 DIAGNOSIS — Z1231 Encounter for screening mammogram for malignant neoplasm of breast: Secondary | ICD-10-CM

## 2023-07-21 ENCOUNTER — Other Ambulatory Visit: Payer: Self-pay

## 2023-07-21 ENCOUNTER — Encounter: Payer: Self-pay | Admitting: Family Medicine

## 2023-07-21 ENCOUNTER — Ambulatory Visit (INDEPENDENT_AMBULATORY_CARE_PROVIDER_SITE_OTHER): Admitting: Family Medicine

## 2023-07-21 VITALS — BP 124/88 | HR 83 | Ht 61.0 in | Wt 138.0 lb

## 2023-07-21 DIAGNOSIS — M79641 Pain in right hand: Secondary | ICD-10-CM | POA: Diagnosis not present

## 2023-07-21 DIAGNOSIS — E559 Vitamin D deficiency, unspecified: Secondary | ICD-10-CM

## 2023-07-21 DIAGNOSIS — E538 Deficiency of other specified B group vitamins: Secondary | ICD-10-CM | POA: Diagnosis not present

## 2023-07-21 DIAGNOSIS — M109 Gout, unspecified: Secondary | ICD-10-CM | POA: Diagnosis not present

## 2023-07-21 DIAGNOSIS — M79642 Pain in left hand: Secondary | ICD-10-CM

## 2023-07-21 DIAGNOSIS — M2550 Pain in unspecified joint: Secondary | ICD-10-CM

## 2023-07-21 NOTE — Assessment & Plan Note (Signed)
 Recheck labs see if anything else is contributing at this moment.  Discussed with patient to continue to monitor diet.  May need potential treatments including medications if continuing to have elevated uric acid levels.  We discussed with patient that some of this could be more inflammatory arthritis necessary but patient does not think it would change management at this time.  Follow-up again in 6 to 8 weeks.

## 2023-07-21 NOTE — Assessment & Plan Note (Signed)
 Recheck B12

## 2023-07-21 NOTE — Patient Instructions (Signed)
Labs today See me in 3 months

## 2023-07-22 LAB — COMPREHENSIVE METABOLIC PANEL WITH GFR
AG Ratio: 1.8 (calc) (ref 1.0–2.5)
ALT: 18 U/L (ref 6–29)
AST: 24 U/L (ref 10–35)
Albumin: 4.2 g/dL (ref 3.6–5.1)
Alkaline phosphatase (APISO): 93 U/L (ref 37–153)
BUN: 12 mg/dL (ref 7–25)
CO2: 25 mmol/L (ref 20–32)
Calcium: 9.3 mg/dL (ref 8.6–10.4)
Chloride: 105 mmol/L (ref 98–110)
Creat: 0.59 mg/dL (ref 0.50–1.05)
Globulin: 2.3 g/dL (ref 1.9–3.7)
Glucose, Bld: 80 mg/dL (ref 65–99)
Potassium: 4.4 mmol/L (ref 3.5–5.3)
Sodium: 139 mmol/L (ref 135–146)
Total Bilirubin: 0.7 mg/dL (ref 0.2–1.2)
Total Protein: 6.5 g/dL (ref 6.1–8.1)
eGFR: 98 mL/min/{1.73_m2} (ref >=60)

## 2023-07-22 LAB — VITAMIN B12: Vitamin B-12: 474 pg/mL (ref 200–1100)

## 2023-07-22 LAB — CBC WITH DIFFERENTIAL/PLATELET
Absolute Lymphocytes: 1224 {cells}/uL (ref 850–3900)
Absolute Monocytes: 286 {cells}/uL (ref 200–950)
Basophils Absolute: 10 {cells}/uL (ref 0–200)
Basophils Relative: 0.3 %
Eosinophils Absolute: 68 {cells}/uL (ref 15–500)
Eosinophils Relative: 2 %
HCT: 44.4 % (ref 35.0–45.0)
Hemoglobin: 14.1 g/dL (ref 11.7–15.5)
MCH: 31.9 pg (ref 27.0–33.0)
MCHC: 31.8 g/dL — ABNORMAL LOW (ref 32.0–36.0)
MCV: 100.5 fL — ABNORMAL HIGH (ref 80.0–100.0)
MPV: 10.9 fL (ref 7.5–12.5)
Monocytes Relative: 8.4 %
Neutro Abs: 1812 {cells}/uL (ref 1500–7800)
Neutrophils Relative %: 53.3 %
Platelets: 272 10*3/uL (ref 140–400)
RBC: 4.42 10*6/uL (ref 3.80–5.10)
RDW: 12.5 % (ref 11.0–15.0)
Total Lymphocyte: 36 %
WBC: 3.4 10*3/uL — ABNORMAL LOW (ref 3.8–10.8)

## 2023-07-22 LAB — URIC ACID: Uric Acid, Serum: 5.1 mg/dL (ref 2.5–7.0)

## 2023-07-22 LAB — VITAMIN D 25 HYDROXY (VIT D DEFICIENCY, FRACTURES): Vit D, 25-Hydroxy: 69 ng/mL (ref 30–100)

## 2023-07-23 ENCOUNTER — Ambulatory Visit: Payer: Self-pay | Admitting: Family Medicine

## 2023-07-24 NOTE — Telephone Encounter (Signed)
 Patient is scheduled

## 2023-07-26 ENCOUNTER — Ambulatory Visit (INDEPENDENT_AMBULATORY_CARE_PROVIDER_SITE_OTHER)

## 2023-07-26 DIAGNOSIS — E538 Deficiency of other specified B group vitamins: Secondary | ICD-10-CM

## 2023-07-26 MED ORDER — CYANOCOBALAMIN 1000 MCG/ML IJ SOLN
1000.0000 ug | Freq: Once | INTRAMUSCULAR | Status: AC
  Administered 2023-07-26: 1000 ug via INTRAMUSCULAR

## 2023-07-26 NOTE — Progress Notes (Signed)
 Patient received B12 1,000 mcg/mL  in left deltoid. Patient tolerated well.

## 2023-08-01 ENCOUNTER — Ambulatory Visit (INDEPENDENT_AMBULATORY_CARE_PROVIDER_SITE_OTHER)

## 2023-08-01 DIAGNOSIS — E538 Deficiency of other specified B group vitamins: Secondary | ICD-10-CM | POA: Diagnosis not present

## 2023-08-01 MED ORDER — CYANOCOBALAMIN 1000 MCG/ML IJ SOLN: 1000.0000 ug | Freq: Once | INTRAMUSCULAR | 0 refills | Status: DC

## 2023-08-01 MED ORDER — CYANOCOBALAMIN 1000 MCG/ML IJ SOLN
1000.0000 ug | Freq: Once | INTRAMUSCULAR | Status: AC
  Administered 2023-08-01: 1000 ug via INTRAMUSCULAR

## 2023-08-01 NOTE — Progress Notes (Signed)
Patient received B12 injection per Dr. Katrinka Blazing. Patient tolerated injection well.

## 2023-08-09 ENCOUNTER — Ambulatory Visit (INDEPENDENT_AMBULATORY_CARE_PROVIDER_SITE_OTHER)

## 2023-08-09 DIAGNOSIS — E538 Deficiency of other specified B group vitamins: Secondary | ICD-10-CM | POA: Diagnosis not present

## 2023-08-09 MED ORDER — CYANOCOBALAMIN 1000 MCG/ML IJ SOLN
1000.0000 ug | Freq: Once | INTRAMUSCULAR | Status: AC
  Administered 2023-08-09: 1000 ug via INTRAMUSCULAR

## 2023-08-09 NOTE — Progress Notes (Signed)
 Patient received B12 1000 mcg/ml injection today in left deltoid. Patient tolerated well.

## 2023-08-15 ENCOUNTER — Ambulatory Visit (INDEPENDENT_AMBULATORY_CARE_PROVIDER_SITE_OTHER)

## 2023-08-15 DIAGNOSIS — E538 Deficiency of other specified B group vitamins: Secondary | ICD-10-CM | POA: Diagnosis not present

## 2023-08-15 MED ORDER — CYANOCOBALAMIN 1000 MCG/ML IJ SOLN
1000.0000 ug | Freq: Once | INTRAMUSCULAR | Status: AC
  Administered 2023-08-15: 1000 ug via INTRAMUSCULAR

## 2023-08-15 NOTE — Progress Notes (Signed)
 Patient received B12 1000 mcg/ml in right deltoid today. Patient tolerated well.

## 2023-08-16 NOTE — Progress Notes (Unsigned)
 [name] [address] [place] [address] Phone: [phone] Subjective:   ISusannah Gully, am serving as a scribe for Dr. Arthea Claudene.  I'm seeing this patient by the request  of:  Jodie Lavern CROME, MD  CC: Bilateral hand pain  YEP:Dlagzrupcz  07/21/2023 Recheck B12  Recheck labs see if anything else is contributing at this moment. Discussed with patient to continue to monitor diet. May need potential treatments including medications if continuing to have elevated uric acid levels. We discussed with patient that some of this could be more inflammatory arthritis necessary but patient does not think it would change management at this time. Follow-up again in 6 to 8 weeks.   Updated 08/21/2023 DEEANNE DEININGER is a 68 y.o. female coming in with complaint of B hand pain. Here to get a thumb cyst drained. No other symptoms today.       Past Medical History:  Diagnosis Date   Alcoholism in remission (HCC)    quit 1990, approx   Allergy    Arthritis    Asthma    Past Surgical History:  Procedure Laterality Date   ABDOMINAL HYSTERECTOMY     EYE SURGERY     Social History   Socioeconomic History   Marital status: Married    Spouse name: Not on file   Number of children: Not on file   Years of education: Not on file   Highest education level: Master's degree (e.g., MA, MS, MEng, MEd, MSW, MBA)  Occupational History   Not on file  Tobacco Use   Smoking status: Never   Smokeless tobacco: Never  Vaping Use   Vaping status: Never Used  Substance and Sexual Activity   Alcohol use: Never   Drug use: Never   Sexual activity: Yes    Birth control/protection: Post-menopausal  Other Topics Concern   Not on file  Social History Narrative   Not on file   Social Drivers of Health   Financial Resource Strain: Low Risk  (04/01/2023)   Overall Financial Resource Strain (CARDIA)    Difficulty of Paying Living Expenses: Not very hard  Food  Insecurity: No Food Insecurity (04/01/2023)   Hunger Vital Sign    Worried About Running Out of Food in the Last Year: Never true    Ran Out of Food in the Last Year: Never true  Transportation Needs: No Transportation Needs (04/01/2023)   PRAPARE - Transportation    Lack of Transportation (Medical): No    Lack of Transportation (Non-Medical): No  Physical Activity: Sufficiently Active (04/01/2023)   Exercise Vital Sign    Days of Exercise per Week: 5 days    Minutes of Exercise per Session: 60 min  Stress: No Stress Concern Present (04/01/2023)   [employer] of Occupational Health - Occupational Stress Questionnaire    Feeling of Stress : Not at all  Social Connections: Socially Integrated (04/01/2023)   Social Connection and Isolation Panel    Frequency of Communication with Friends and Family: More than three times a week    Frequency of Social Gatherings with Friends and Family: More than three times a week    Attends Religious Services: More than 4 times per year    Active Member of [employer] or Organizations: Yes    Attends Engineer, structural: More than 4 times per year    Marital Status: Married   Allergies  Allergen Reactions   Aspirin Anaphylaxis, Hives, Itching, Shortness Of Breath  and Other (See Comments)   Elemental Sulfur Anaphylaxis, Hives, Itching, Rash, Shortness Of Breath and Swelling   Penicillins Anaphylaxis, Hives, Itching, Nausea And Vomiting, Rash, Shortness Of Breath and Other (See Comments)   Sulfa Antibiotics Anaphylaxis   Tinidazole Other (See Comments)   Chocolate Other (See Comments)   Codeine Itching and Other (See Comments)   Other Other (See Comments)   Vitamin C Other (See Comments)   Pseudoephedrine-Codeine-Gg Hives, Itching and Palpitations   Family History  Problem Relation Age of Onset   Breast cancer Paternal Aunt    Breast cancer Paternal Grandmother       Current Outpatient Medications (Respiratory):    albuterol (VENTOLIN  HFA) 108 (90 Base) MCG/ACT inhaler, SMARTSIG:1-2 Puff(s) Via Inhaler Every 4-6 Hours PRN   fexofenadine (ALLEGRA ALLERGY) 60 MG tablet, Allegra    Current Outpatient Medications (Other):    Prasterone (INTRAROSA) 6.5 MG INST, Place vaginally.   Reviewed prior external information including notes and imaging from  primary care provider As well as notes that were available from care everywhere and other healthcare systems.  Past medical history, social, surgical and family history all reviewed in electronic medical record.  No pertanent information unless stated regarding to the chief complaint.   Review of Systems:  No headache, visual changes, nausea, vomiting, diarrhea, constipation, dizziness, abdominal pain, skin rash, fevers, chills, night sweats, weight loss, swollen lymph nodes, body aches, joint swelling, chest pain, shortness of breath, mood changes. POSITIVE muscle aches  Objective  There were no vitals taken for this visit.   General: No apparent distress alert and oriented x3 mood and affect normal, dressed appropriately.  HEENT: Pupils equal, extraocular movements intact  Respiratory: Patient's speak in full sentences and does not appear short of breath  Cardiovascular: No lower extremity edema, non tender, no erythema      Impression and Recommendations:

## 2023-08-21 ENCOUNTER — Ambulatory Visit: Admitting: Family Medicine

## 2023-08-21 ENCOUNTER — Other Ambulatory Visit: Payer: Self-pay

## 2023-08-21 VITALS — BP 124/82 | HR 87 | Ht 61.0 in | Wt 142.0 lb

## 2023-08-21 DIAGNOSIS — M79642 Pain in left hand: Secondary | ICD-10-CM

## 2023-08-21 DIAGNOSIS — M67441 Ganglion, right hand: Secondary | ICD-10-CM | POA: Diagnosis not present

## 2023-08-21 DIAGNOSIS — M67449 Ganglion, unspecified hand: Secondary | ICD-10-CM

## 2023-08-21 DIAGNOSIS — M79641 Pain in right hand: Secondary | ICD-10-CM

## 2023-08-22 ENCOUNTER — Encounter: Payer: Self-pay | Admitting: Family Medicine

## 2023-08-22 DIAGNOSIS — M67449 Ganglion, unspecified hand: Secondary | ICD-10-CM | POA: Insufficient documentation

## 2023-08-22 NOTE — Assessment & Plan Note (Signed)
 Small amount of steroid use to try to keep it from coming back.  We discussed with her being so superficial surgical intervention could be possible but as long as this works no need to change management discussed with patient to monitor for any redness.  Postinjection instructions given.  Follow-up with me again in 3 to 4 months

## 2023-09-04 ENCOUNTER — Ambulatory Visit
Admission: RE | Admit: 2023-09-04 | Discharge: 2023-09-04 | Disposition: A | Discharge status: Home / Self Care | Source: Ambulatory Visit | Attending: Family Medicine | Admitting: Family Medicine

## 2023-09-04 DIAGNOSIS — Z1231 Encounter for screening mammogram for malignant neoplasm of breast: Secondary | ICD-10-CM

## 2023-09-11 ENCOUNTER — Ambulatory Visit (INDEPENDENT_AMBULATORY_CARE_PROVIDER_SITE_OTHER)

## 2023-09-11 DIAGNOSIS — E538 Deficiency of other specified B group vitamins: Secondary | ICD-10-CM | POA: Diagnosis not present

## 2023-09-11 MED ORDER — CYANOCOBALAMIN 1000 MCG/ML IJ SOLN
1000.0000 ug | Freq: Once | INTRAMUSCULAR | Status: AC
  Administered 2023-09-11: 1000 ug via INTRAMUSCULAR

## 2023-09-11 NOTE — Progress Notes (Signed)
 Patient received B12 injection in right deltoid. Patient tolerated well.

## 2023-10-03 ENCOUNTER — Ambulatory Visit (INDEPENDENT_AMBULATORY_CARE_PROVIDER_SITE_OTHER)

## 2023-10-03 VITALS — BP 130/78 | HR 69 | Temp 98.2°F | Ht 61.25 in | Wt 140.4 lb

## 2023-10-03 DIAGNOSIS — Z0000 Encounter for general adult medical examination without abnormal findings: Secondary | ICD-10-CM

## 2023-10-03 NOTE — Patient Instructions (Signed)
 Ms. Strum , Thank you for taking time out of your busy schedule to complete your Annual Wellness Visit with me. I enjoyed our conversation and look forward to speaking with you again next year. I, as well as your care team,  appreciate your ongoing commitment to your health goals. Please review the following plan we discussed and let me know if I can assist you in the future. Your Game plan/ To Do List    Referrals: If you haven't heard from the office you've been referred to, please reach out to them at the phone provided.   Follow up Visits: We will see or speak with you next year for your Next Medicare AWV with our clinical staff Have you seen your provider in the last 6 months (3 months if uncontrolled diabetes)? Yes  Clinician Recommendations:  Aim for 30 minutes of exercise or brisk walking, 6-8 glasses of water, and 5 servings of fruits and vegetables each day.       This is a list of the screenings recommended for you:  Health Maintenance  Topic Date Due   COVID-19 Vaccine (4 - 2024-25 season) 04/07/2023   Medicare Annual Wellness Visit  09/05/2023   DEXA scan (bone density measurement)  02/11/2024   Pneumococcal Vaccine for age over 33 (3 of 3 - PCV20 or PCV21) 08/12/2024   Mammogram  09/03/2025   Colon Cancer Screening  08/08/2026   DTaP/Tdap/Td vaccine (3 - Td or Tdap) 08/08/2028   Flu Shot  Completed   Hepatitis C Screening  Completed   Zoster (Shingles) Vaccine  Completed   HPV Vaccine  Aged Out   Meningitis B Vaccine  Aged Out    Advanced directives: (Copy Requested) Please bring a copy of your health care power of attorney and living will to the office to be added to your chart at your convenience. You can mail to [facility] [address] [place], [place] [address] or email to [email or web address] Advance Care Planning is important because it:  [x]  Makes sure you receive the medical care that is consistent with your values, goals, and  preferences  [x]  It provides guidance to your family and loved ones and reduces their decisional burden about whether or not they are making the right decisions based on your wishes.  Follow the link provided in your after visit summary or read over the paperwork we have mailed to you to help you started getting your Advance Directives in place. If you need assistance in completing these, please reach out to us  so that we can help you!  See attachments for Preventive Care and Fall Prevention Tips.

## 2023-10-03 NOTE — Progress Notes (Signed)
 Subjective:   Lisa Contreras is a 67 y.o. who presents for a Medicare Wellness preventive visit.  As a reminder, Annual Wellness Visits don't include a physical exam, and some assessments may be limited, especially if this visit is performed virtually. We may recommend an in-person follow-up visit with your provider if needed.  Visit Complete: In person    Persons Participating in Visit: Patient.  AWV Questionnaire: Yes: Patient Medicare AWV questionnaire was completed by the patient on 09/26/23; I have confirmed that all information answered by patient is correct and no changes since this date.  Cardiac Risk Factors include: advanced age (>88men, >19 women)     Objective:    Today's Vitals   10/03/23 0739  BP: 130/78  Pulse: 69  Temp: 98.2 F (36.8 C)  SpO2: 99%  Weight: 140 lb 6.4 oz (63.7 kg)  Height: 5' 1.25 (1.556 m)   Body mass index is 26.31 kg/m.     10/03/2023    7:51 AM 11/27/2020    8:28 AM  Advanced Directives  Does Patient Have a Medical Advance Directive? Yes Yes  Type of Estate agent of [place];Living will Healthcare Power of [place];Living will  Does patient want to make changes to medical advance directive?  No - Patient declined  Copy of Healthcare Power of Attorney in Chart? No - copy requested   Would patient like information on creating a medical advance directive?  No - Patient declined    Current Medications (verified) Outpatient Encounter Medications as of 10/03/2023  Medication Sig   albuterol (VENTOLIN HFA) 108 (90 Base) MCG/ACT inhaler SMARTSIG:1-2 Puff(s) Via Inhaler Every 4-6 Hours PRN   fexofenadine (ALLEGRA ALLERGY) 60 MG tablet Allegra   Prasterone (INTRAROSA) 6.5 MG INST Place vaginally.   No facility-administered encounter medications on file as of 10/03/2023.    Allergies (verified) Aspirin, Elemental sulfur, Penicillins, Sulfa antibiotics, Tinidazole, Chocolate, Codeine, Other, Vitamin c, and  Pseudoephedrine-codeine-gg   History: Past Medical History:  Diagnosis Date   Alcoholism in remission (HCC)    quit 1990, approx   Allergy    Arthritis    Asthma    Past Surgical History:  Procedure Laterality Date   ABDOMINAL HYSTERECTOMY     EYE SURGERY     Family History  Problem Relation Age of Onset   Breast cancer Paternal Aunt    Breast cancer Paternal Grandmother    Social History   Socioeconomic History   Marital status: Married    Spouse name: Not on file   Number of children: Not on file   Years of education: Not on file   Highest education level: Master's degree (e.g., MA, MS, MEng, MEd, MSW, MBA)  Occupational History   Not on file  Tobacco Use   Smoking status: Never   Smokeless tobacco: Never  Vaping Use   Vaping status: Never Used  Substance and Sexual Activity   Alcohol use: Never   Drug use: Never   Sexual activity: Yes    Birth control/protection: Post-menopausal  Other Topics Concern   Not on file  Social History Narrative   Not on file   Social Drivers of Health   Financial Resource Strain: Low Risk  (09/26/2023)   Overall Financial Resource Strain (CARDIA)    Difficulty of Paying Living Expenses: Not hard at all  Food Insecurity: No Food Insecurity (09/26/2023)   Hunger Vital Sign    Worried About Running Out of Food in the Last Year: Never true  Ran Out of Food in the Last Year: Never true  Transportation Needs: No Transportation Needs (09/26/2023)   PRAPARE - Administrator, Civil Service (Medical): No    Lack of Transportation (Non-Medical): No  Physical Activity: Sufficiently Active (09/26/2023)   Exercise Vital Sign    Days of Exercise per Week: 6 days    Minutes of Exercise per Session: 60 min  Stress: No Stress Concern Present (09/26/2023)   [employer] of Occupational Health - Occupational Stress Questionnaire    Feeling of Stress: Not at all  Social Connections: Socially Integrated (09/26/2023)   Social  Connection and Isolation Panel    Frequency of Communication with Friends and Family: More than three times a week    Frequency of Social Gatherings with Friends and Family: More than three times a week    Attends Religious Services: More than 4 times per year    Active Member of [employer] or Organizations: Yes    Attends Engineer, structural: More than 4 times per year    Marital Status: Married    Tobacco Counseling Counseling given: Not Answered    Clinical Intake:  Pre-visit preparation completed: Yes  Pain : No/denies pain     BMI - recorded: 26.31 Nutritional Status: BMI 25 -29 Overweight Nutritional Risks: None  No results found for: HGBA1C   How often do you need to have someone help you when you read instructions, pamphlets, or other written materials from your doctor or pharmacy?: 1 - Never  Interpreter Needed?: No  Information entered by :: Ellouise Sever, LPN   Activities of Daily Living     09/26/2023    2:57 PM  In your present state of health, do you have any difficulty performing the following activities:  Hearing? 0  Vision? 0  Difficulty concentrating or making decisions? 0  Walking or climbing stairs? 0  Dressing or bathing? 0  Doing errands, shopping? 0  Preparing Food and eating ? N  Using the Toilet? N  In the past six months, have you accidently leaked urine? N  Do you have problems with loss of bowel control? N  Managing your Medications? N  Managing your Finances? N  Housekeeping or managing your Housekeeping? N    Patient Care Team: Jodie Lavern CROME, MD as PCP - General (Family Medicine) Claudene Arthea HERO, DO as Consulting Physician (Sports Medicine) Sissy Cough, MD as Consulting Physician (Orthopedic Surgery) [place], Amy, MD as Consulting Physician (Dermatology) Kristie Lamprey, MD as Consulting Physician (Gastroenterology)  I have updated your Care Teams any recent Medical Services you may have received from other  providers in the past year.     Assessment:   This is a routine wellness examination for [name].  Hearing/Vision screen Hearing Screening - Comments:: Pt denies any hearing issues  Vision Screening - Comments:: Wears rx glasses - up to date with routine eye exams with Dr Jarold    Goals Addressed             This Visit's Progress    Patient Stated       Weight and cholesterol down        Depression Screen     10/03/2023    7:49 AM 04/05/2023    8:53 AM  PHQ 2/9 Scores  PHQ - 2 Score 0 0    Fall Risk     09/26/2023    2:57 PM 04/05/2023    8:53 AM  Fall Risk   Falls  in the past year? 0 0  Number falls in past yr:  0  Injury with Fall?  0  Risk for fall due to : No Fall Risks No Fall Risks  Follow up Falls prevention discussed Falls evaluation completed    MEDICARE RISK AT HOME:  Medicare Risk at Home Any stairs in or around the home?: (Patient-Rptd) Yes If so, are there any without handrails?: (Patient-Rptd) No Home free of loose throw rugs in walkways, pet beds, electrical cords, etc?: (Patient-Rptd) Yes Adequate lighting in your home to reduce risk of falls?: (Patient-Rptd) Yes Life alert?: (Patient-Rptd) No Use of a cane, walker or w/c?: (Patient-Rptd) No Grab bars in the bathroom?: (Patient-Rptd) No Shower chair or bench in shower?: (Patient-Rptd) No Elevated toilet seat or a handicapped toilet?: (Patient-Rptd) No  TIMED UP AND GO:  Was the test performed?  Yes  Length of time to ambulate 10 feet: 10 sec Gait steady and fast without use of assistive device  Cognitive Function: 6CIT completed        10/03/2023    7:51 AM  6CIT Screen  What Year? 0 points  What month? 0 points  What time? 0 points  Count back from 20 0 points  Months in reverse 0 points  Repeat phrase 0 points  Total Score 0 points    Immunizations Immunization History  Administered Date(s) Administered   Hepatitis A, Ped/Adol-2 Dose 02/12/2018, 08/09/2018   INFLUENZA,  HIGH DOSE SEASONAL PF 09/28/2023   Influenza,inj,Quad PF,6+ Mos 11/13/2015, 09/25/2017, 09/25/2017   Influenza,trivalent, recombinat, inj, PF 10/09/2010, 10/22/2014   Influenza-Unspecified 09/22/2022   PFIZER(Purple Top)SARS-COV-2 Vaccination 04/12/2019, 05/03/2019   Pneumococcal Conjugate-13 08/09/2018   Pneumococcal Polysaccharide-23 06/24/2008, 08/13/2019   Tdap 06/24/2008, 08/09/2018   Unspecified SARS-COV-2 Vaccination 10/07/2022   Zoster Recombinant(Shingrix) 08/09/2018, 02/13/2019   Zoster, Live 10/22/2014    Screening Tests Health Maintenance  Topic Date Due   COVID-19 Vaccine (4 - 2024-25 season) 04/07/2023   DEXA SCAN  02/11/2024   Pneumococcal Vaccine: 50+ Years (3 of 3 - PCV20 or PCV21) 08/12/2024   Medicare Annual Wellness (AWV)  10/02/2024   MAMMOGRAM  09/03/2025   Colonoscopy  08/08/2026   DTaP/Tdap/Td (3 - Td or Tdap) 08/08/2028   INFLUENZA VACCINE  Completed   Hepatitis C Screening  Completed   Zoster Vaccines- Shingrix  Completed   HPV VACCINES  Aged Out   Meningococcal B Vaccine  Aged Out    Health Maintenance  Health Maintenance Due  Topic Date Due   COVID-19 Vaccine (4 - 2024-25 season) 04/07/2023   Health Maintenance Items Addressed: See Nurse Notes at the end of this note  Additional Screening:  Vision Screening: Recommended annual ophthalmology exams for early detection of glaucoma and other disorders of the eye. Would you like a referral to an eye doctor? No    Dental Screening: Recommended annual dental exams for proper oral hygiene  Community Resource Referral / Chronic Care Management: CRR required this visit?  No   CCM required this visit?  No   Plan:    I have personally reviewed and noted the following in the patient's chart:   Medical and social history Use of alcohol, tobacco or illicit drugs  Current medications and supplements including opioid prescriptions. Patient is not currently taking opioid prescriptions. Functional  ability and status Nutritional status Physical activity Advanced directives List of other physicians Hospitalizations, surgeries, and ER visits in previous 12 months Vitals Screenings to include cognitive, depression, and falls Referrals and appointments  In addition, I have reviewed and discussed with patient certain preventive protocols, quality metrics, and best practice recommendations. A written personalized care plan for preventive services as well as general preventive health recommendations were provided to patient.   Ellouise VEAR Haws, LPN   1/73/7974   After Visit Summary: (In Person-Printed) AVS printed and given to the patient  Notes: Nothing significant to report at this time.

## 2023-10-13 ENCOUNTER — Ambulatory Visit

## 2023-10-13 DIAGNOSIS — E538 Deficiency of other specified B group vitamins: Secondary | ICD-10-CM

## 2023-10-13 MED ORDER — CYANOCOBALAMIN 1000 MCG/ML IJ SOLN
1000.0000 ug | Freq: Once | INTRAMUSCULAR | Status: AC
Start: 2023-10-13 — End: 2023-10-13
  Administered 2023-10-13: 1000 ug via INTRAMUSCULAR

## 2023-10-13 NOTE — Progress Notes (Signed)
 Patient received B12 1000 mcg/ml in right arm. Patient tolerated well.

## 2023-10-19 NOTE — Progress Notes (Signed)
 [name] [address] [place] [address] Phone: [phone] Subjective:   LILLETTE Berwyn Posey, am serving as a scribe for Dr. Arthea Claudene.  I'm seeing this patient by the request  of:  Jodie Lavern CROME, MD  CC: Thumb pain follow-up  YEP:Dlagzrupcz  08/21/2023   Small amount of steroid use to try to keep it from coming back.  We discussed with her being so superficial surgical intervention could be possible but as long as this works no need to change management discussed with patient to monitor for any redness.  Postinjection instructions given.  Follow-up with me again in 3 to 4 months     Update 10/24/2023 ANAIA FRITH is a 68 y.o. female coming in with complaint of ganglion cyst of R thumb. Patient states that tart cherry is helping pain. Is causing loose stool. B12 injections have also been helpful. Supplementing with B vitamins between injections. Last injection on 10/13/2023.     Patient also been followed for B12 deficiency.  Last injection was September 5.  Past Medical History:  Diagnosis Date   Alcoholism in remission (HCC)    quit 1990, approx   Allergy    Arthritis    Asthma    Past Surgical History:  Procedure Laterality Date   ABDOMINAL HYSTERECTOMY     EYE SURGERY     Social History   Socioeconomic History   Marital status: Married    Spouse name: Not on file   Number of children: Not on file   Years of education: Not on file   Highest education level: Master's degree (e.g., MA, MS, MEng, MEd, MSW, MBA)  Occupational History   Not on file  Tobacco Use   Smoking status: Never   Smokeless tobacco: Never  Vaping Use   Vaping status: Never Used  Substance and Sexual Activity   Alcohol use: Never   Drug use: Never   Sexual activity: Yes    Birth control/protection: Post-menopausal  Other Topics Concern   Not on file  Social History Narrative   Not on file   Social Drivers of Health   Financial Resource Strain:  Low Risk  (09/26/2023)   Overall Financial Resource Strain (CARDIA)    Difficulty of Paying Living Expenses: Not hard at all  Food Insecurity: No Food Insecurity (09/26/2023)   Hunger Vital Sign    Worried About Running Out of Food in the Last Year: Never true    Ran Out of Food in the Last Year: Never true  Transportation Needs: No Transportation Needs (09/26/2023)   PRAPARE - Administrator, Civil Service (Medical): No    Lack of Transportation (Non-Medical): No  Physical Activity: Sufficiently Active (09/26/2023)   Exercise Vital Sign    Days of Exercise per Week: 6 days    Minutes of Exercise per Session: 60 min  Stress: No Stress Concern Present (09/26/2023)   [employer] of Occupational Health - Occupational Stress Questionnaire    Feeling of Stress: Not at all  Social Connections: Socially Integrated (09/26/2023)   Social Connection and Isolation Panel    Frequency of Communication with Friends and Family: More than three times a week    Frequency of Social Gatherings with Friends and Family: More than three times a week    Attends Religious Services: More than 4 times per year    Active Member of [employer] or Organizations: Yes    Attends Banker Meetings: More than  4 times per year    Marital Status: Married   Allergies  Allergen Reactions   Aspirin Anaphylaxis, Hives, Itching, Shortness Of Breath and Other (See Comments)   Elemental Sulfur Anaphylaxis, Hives, Itching, Rash, Shortness Of Breath and Swelling   Penicillins Anaphylaxis, Hives, Itching, Nausea And Vomiting, Rash, Shortness Of Breath and Other (See Comments)   Sulfa Antibiotics Anaphylaxis   Tinidazole Other (See Comments)   Chocolate Other (See Comments)   Codeine Itching and Other (See Comments)   Other Other (See Comments)   Vitamin C Other (See Comments)   Pseudoephedrine-Codeine-Gg Hives, Itching and Palpitations   Family History  Problem Relation Age of Onset   Breast cancer  Paternal Aunt    Breast cancer Paternal Grandmother       Current Outpatient Medications (Respiratory):    albuterol (VENTOLIN HFA) 108 (90 Base) MCG/ACT inhaler, SMARTSIG:1-2 Puff(s) Via Inhaler Every 4-6 Hours PRN   fexofenadine (ALLEGRA ALLERGY) 60 MG tablet, Allegra    Current Outpatient Medications (Other):    Prasterone (INTRAROSA) 6.5 MG INST, Place vaginally.   Reviewed prior external information including notes and imaging from  primary care provider As well as notes that were available from care everywhere and other healthcare systems.  Past medical history, social, surgical and family history all reviewed in electronic medical record.  No pertanent information unless stated regarding to the chief complaint.   Review of Systems:  No headache, visual changes, nausea, vomiting, diarrhea, constipation, dizziness, abdominal pain, skin rash, fevers, chills, night sweats, weight loss, swollen lymph nodes, body aches, joint swelling, chest pain, shortness of breath, mood changes. POSITIVE muscle aches but improving  Objective  Blood pressure 116/82, pulse 74, height 5' 1 (1.549 m), weight 139 lb (63 kg), SpO2 93%.   General: No apparent distress alert and oriented x3 mood and affect normal, dressed appropriately.  HEENT: Pupils equal, extraocular movements intact  Respiratory: Patient's speak in full sentences and does not appear short of breath  Cardiovascular: No lower extremity edema, non tender, no erythema  Hand exam shows mild synovitis of the 2nd and 3rd MCP.  Patient's thumb does not have any significant swelling noted at this time.  Limited muscular skeletal ultrasound was performed and interpreted by CLAUDENE HUSSAR, M  Limited ultrasound of patient's [facility] joint did not show any type of right ganglion cyst noted at this moment.  Still mild synovitis noted of the 2nd and 3rd MCPs.    Impression and Recommendations:     The above documentation has been reviewed and  is accurate and complete Marylouise Mallet M Aydin Cavalieri, DO

## 2023-10-25 ENCOUNTER — Ambulatory Visit (INDEPENDENT_AMBULATORY_CARE_PROVIDER_SITE_OTHER): Admitting: Family Medicine

## 2023-10-25 ENCOUNTER — Other Ambulatory Visit: Payer: Self-pay

## 2023-10-25 ENCOUNTER — Encounter: Payer: Self-pay | Admitting: Family Medicine

## 2023-10-25 VITALS — BP 116/82 | HR 74 | Ht 61.0 in | Wt 139.0 lb

## 2023-10-25 DIAGNOSIS — E538 Deficiency of other specified B group vitamins: Secondary | ICD-10-CM | POA: Diagnosis not present

## 2023-10-25 DIAGNOSIS — M659 Unspecified synovitis and tenosynovitis, unspecified site: Secondary | ICD-10-CM

## 2023-10-25 DIAGNOSIS — M67449 Ganglion, unspecified hand: Secondary | ICD-10-CM

## 2023-10-25 DIAGNOSIS — M79644 Pain in right finger(s): Secondary | ICD-10-CM

## 2023-10-25 NOTE — Assessment & Plan Note (Signed)
 Significant from previous exam.  No significant changes in management at this time.  No reaccumulation noted on ultrasound that was significant.  Follow-up with me again 3 months.

## 2023-10-25 NOTE — Assessment & Plan Note (Signed)
 Continue IM injections.

## 2023-10-25 NOTE — Patient Instructions (Signed)
 Recheck B12 next visit Can stop B6 and B12 tablets See me in 3 months

## 2023-10-25 NOTE — Assessment & Plan Note (Signed)
 Doing much better with the tart cherry but there is some inflammation noted.

## 2023-11-24 ENCOUNTER — Ambulatory Visit (INDEPENDENT_AMBULATORY_CARE_PROVIDER_SITE_OTHER)

## 2023-11-24 DIAGNOSIS — E538 Deficiency of other specified B group vitamins: Secondary | ICD-10-CM

## 2023-11-24 MED ORDER — CYANOCOBALAMIN 1000 MCG/ML IJ SOLN
1000.0000 ug | Freq: Once | INTRAMUSCULAR | Status: AC
  Administered 2023-11-24: 1000 ug via INTRAMUSCULAR

## 2023-11-24 NOTE — Progress Notes (Signed)
 Patient received B12 1000 mcg/ml in right arm. Patient tolerated well.

## 2023-12-25 ENCOUNTER — Ambulatory Visit

## 2023-12-25 DIAGNOSIS — E538 Deficiency of other specified B group vitamins: Secondary | ICD-10-CM

## 2023-12-25 MED ORDER — CYANOCOBALAMIN 1000 MCG/ML IJ SOLN
1000.0000 ug | Freq: Once | INTRAMUSCULAR | Status: AC
  Administered 2023-12-25: 1000 ug via INTRAMUSCULAR

## 2023-12-25 NOTE — Progress Notes (Signed)
 Patient received B12 1000 mcg/ml in right arm. Patient tolerated well.

## 2024-01-22 NOTE — Progress Notes (Unsigned)
 [facility] [address] [place] [address] Phone: [phone] Subjective:   Lisa Contreras, am serving as a scribe for Dr. Arthea Claudene.  I'm seeing this patient by the request  of:  Jodie Lavern CROME, MD  CC: Right thumb pain  YEP:Dlagzrupcz  10/25/2023  Continue IM injections.     Doing much better with the tart cherry but there is some inflammation noted.    Significant from previous exam.  No significant changes in management at this time.  No reaccumulation noted on ultrasound that was significant.  Follow-up with me again 3 months.     Update 01/24/2024 Lisa Contreras is a 68 y.o. female coming in with complaint of R thumb pain.  Found to have a ganglion cyst previously.  Last aspiration was in July of this year.  Patient states that she is doing a lot better. No new cysts and pain is intermittent. Weather changes make her pain increase. Uses tart cherry BID and she said that she feels that this is helpful. Also taking Vit D.    Receives B12 injections at regular intervals    Past Medical History:  Diagnosis Date   Alcoholism in remission (HCC)    quit 1990, approx   Allergy    Arthritis    Asthma    Past Surgical History:  Procedure Laterality Date   ABDOMINAL HYSTERECTOMY     EYE SURGERY     Social History   Socioeconomic History   Marital status: Married    Spouse name: Not on file   Number of children: Not on file   Years of education: Not on file   Highest education level: Master's degree (e.g., MA, MS, MEng, MEd, MSW, MBA)  Occupational History   Not on file  Tobacco Use   Smoking status: Never   Smokeless tobacco: Never  Vaping Use   Vaping status: Never Used  Substance and Sexual Activity   Alcohol use: Never   Drug use: Never   Sexual activity: Yes    Birth control/protection: Post-menopausal  Other Topics Concern   Not on file  Social History Narrative   Not on file   Social Drivers of Health    Tobacco Use: Low Risk (10/25/2023)   Patient History    Smoking Tobacco Use: Never    Smokeless Tobacco Use: Never    Passive Exposure: Not on file  Financial Resource Strain: Low Risk (09/26/2023)   Overall Financial Resource Strain (CARDIA)    Difficulty of Paying Living Expenses: Not hard at all  Food Insecurity: No Food Insecurity (09/26/2023)   Epic    Worried About Programme Researcher, Broadcasting/film/video in the Last Year: Never true    Ran Out of Food in the Last Year: Never true  Transportation Needs: No Transportation Needs (09/26/2023)   Epic    Lack of Transportation (Medical): No    Lack of Transportation (Non-Medical): No  Physical Activity: Sufficiently Active (09/26/2023)   Exercise Vital Sign    Days of Exercise per Week: 6 days    Minutes of Exercise per Session: 60 min  Stress: No Stress Concern Present (09/26/2023)   [employer] of Occupational Health - Occupational Stress Questionnaire    Feeling of Stress: Not at all  Social Connections: Socially Integrated (09/26/2023)   Social Connection and Isolation Panel    Frequency of Communication with Friends and Family: More than three times a week    Frequency of Social Gatherings with  Friends and Family: More than three times a week    Attends Religious Services: More than 4 times per year    Active Member of Clubs or Organizations: Yes    Attends Banker Meetings: More than 4 times per year    Marital Status: Married  Depression (PHQ2-9): Low Risk (10/03/2023)   Depression (PHQ2-9)    PHQ-2 Score: 0  Alcohol Screen: Not on file  Housing: Low Risk (09/26/2023)   Epic    Unable to Pay for Housing in the Last Year: No    Number of Times Moved in the Last Year: 0    Homeless in the Last Year: No  Utilities: Not At Risk (10/03/2023)   Epic    Threatened with loss of utilities: No  Health Literacy: Adequate Health Literacy (10/03/2023)   B1300 Health Literacy    Frequency of need for help with medical instructions:  Never   Allergies[1] Family History  Problem Relation Age of Onset   Breast cancer Paternal Aunt    Breast cancer Paternal Grandmother     Current Outpatient Medications (Respiratory):    albuterol (VENTOLIN HFA) 108 (90 Base) MCG/ACT inhaler, SMARTSIG:1-2 Puff(s) Via Inhaler Every 4-6 Hours PRN   fexofenadine (ALLEGRA ALLERGY) 60 MG tablet, Allegra  Current Outpatient Medications (Other):    Prasterone (INTRAROSA) 6.5 MG INST, Place vaginally.   Reviewed prior external information including notes and imaging from  primary care provider As well as notes that were available from care everywhere and other healthcare systems.  Past medical history, social, surgical and family history all reviewed in electronic medical record.  No pertanent information unless stated regarding to the chief complaint.   Review of Systems:  No headache, visual changes, nausea, vomiting, diarrhea, constipation, dizziness, abdominal pain, skin rash, fevers, chills, night sweats, weight loss, swollen lymph nodes, body aches, joint swelling, chest pain, shortness of breath, mood changes. POSITIVE muscle aches  Objective  Blood pressure 112/76, pulse 84, height 5' 1 (1.549 m), weight 146 lb (66.2 kg), SpO2 96%.   General: No apparent distress alert and oriented x3 mood and affect normal, dressed appropriately.  HEENT: Pupils equal, extraocular movements intact  Respiratory: Patient's speak in full sentences and does not appear short of breath  Cardiovascular: No lower extremity edema, non tender, no erythema  Right thumb exam shows no significant swelling at this point.  The 2nd and 3rd MCPs also have significantly less redness and swelling from previous exam with some improvement actually in the fluidity of range of motion.   Limited muscular skeletal ultrasound was performed and interpreted by CLAUDENE HUSSAR, M  No significant reaccumulation of any cyst material of any of the MCP joints or the thumb  itself.  Seems to have significantly less of the calcific deposits also noted.   Impression and Recommendations:     The above documentation has been reviewed and is accurate and complete Hussar CHRISTELLA Claudene, DO       [1]  Allergies Allergen Reactions   Aspirin Anaphylaxis, Hives, Itching, Shortness Of Breath and Other (See Comments)   Elemental Sulfur Anaphylaxis, Hives, Itching, Rash, Shortness Of Breath and Swelling   Penicillins Anaphylaxis, Hives, Itching, Nausea And Vomiting, Rash, Shortness Of Breath and Other (See Comments)   Sulfa Antibiotics Anaphylaxis   Tinidazole Other (See Comments)   Chocolate Other (See Comments)   Codeine Itching and Other (See Comments)   Other Other (See Comments)   Vitamin C Other (See Comments)   Pseudoephedrine-Codeine-Gg Hives,  Itching and Palpitations

## 2024-01-24 ENCOUNTER — Ambulatory Visit (INDEPENDENT_AMBULATORY_CARE_PROVIDER_SITE_OTHER): Admitting: Family Medicine

## 2024-01-24 ENCOUNTER — Ambulatory Visit: Payer: Self-pay | Admitting: Family Medicine

## 2024-01-24 ENCOUNTER — Other Ambulatory Visit: Payer: Self-pay

## 2024-01-24 ENCOUNTER — Encounter: Payer: Self-pay | Admitting: Family Medicine

## 2024-01-24 ENCOUNTER — Telehealth: Payer: Self-pay | Admitting: Family Medicine

## 2024-01-24 VITALS — BP 112/76 | HR 84 | Ht 61.0 in | Wt 146.0 lb

## 2024-01-24 DIAGNOSIS — M67449 Ganglion, unspecified hand: Secondary | ICD-10-CM | POA: Diagnosis not present

## 2024-01-24 DIAGNOSIS — G8929 Other chronic pain: Secondary | ICD-10-CM

## 2024-01-24 DIAGNOSIS — M79644 Pain in right finger(s): Secondary | ICD-10-CM

## 2024-01-24 DIAGNOSIS — E538 Deficiency of other specified B group vitamins: Secondary | ICD-10-CM

## 2024-01-24 LAB — CBC WITH DIFFERENTIAL/PLATELET
Basophils Absolute: 0 K/uL (ref 0.0–0.1)
Basophils Relative: 1.1 % (ref 0.0–3.0)
Eosinophils Absolute: 0.1 K/uL (ref 0.0–0.7)
Eosinophils Relative: 1.8 % (ref 0.0–5.0)
HCT: 40.2 % (ref 36.0–46.0)
Hemoglobin: 14 g/dL (ref 12.0–15.0)
Lymphocytes Relative: 35.9 % (ref 12.0–46.0)
Lymphs Abs: 1.2 K/uL (ref 0.7–4.0)
MCHC: 34.8 g/dL (ref 30.0–36.0)
MCV: 94.7 fl (ref 78.0–100.0)
Monocytes Absolute: 0.3 K/uL (ref 0.1–1.0)
Monocytes Relative: 9.9 % (ref 3.0–12.0)
Neutro Abs: 1.7 K/uL (ref 1.4–7.7)
Neutrophils Relative %: 51.3 % (ref 43.0–77.0)
Platelets: 246 K/uL (ref 150.0–400.0)
RBC: 4.25 Mil/uL (ref 3.87–5.11)
RDW: 13 % (ref 11.5–15.5)
WBC: 3.3 K/uL — ABNORMAL LOW (ref 4.0–10.5)

## 2024-01-24 LAB — FOLATE: Folate: 19.8 ng/mL (ref >5.9)

## 2024-01-24 LAB — VITAMIN B12: Vitamin B-12: 402 pg/mL (ref 211–911)

## 2024-01-24 MED ORDER — CYANOCOBALAMIN 1000 MCG/ML IJ SOLN
1000.0000 ug | Freq: Once | INTRAMUSCULAR | Status: AC
  Administered 2024-01-24: 08:00:00 1000 ug via INTRAMUSCULAR

## 2024-01-24 NOTE — Assessment & Plan Note (Signed)
 B12 deficiency and doing great with injections.  Will check labs today but likely will continue with the injections

## 2024-01-24 NOTE — Assessment & Plan Note (Signed)
 Significant improvement at this time.  Can continue to monitor.  Significant decrease in swelling of multiple joints.  Continue to carry out the dosing.  Follow-up with me again in 3 to 4 months

## 2024-01-24 NOTE — Telephone Encounter (Signed)
 Patient called and scheduled monthly B12 through March. Please confirm this is okay.

## 2024-01-24 NOTE — Patient Instructions (Addendum)
 Labs today Good to see you! Continue the tart cherry Be proud of all you accomplished B12 injection today See you again in 3-4 months

## 2024-01-26 LAB — HOMOCYSTEINE: Homocysteine: 11.2 umol/L

## 2024-02-26 ENCOUNTER — Ambulatory Visit

## 2024-02-26 DIAGNOSIS — E538 Deficiency of other specified B group vitamins: Secondary | ICD-10-CM

## 2024-02-26 MED ORDER — CYANOCOBALAMIN 1000 MCG/ML IJ SOLN
1000.0000 ug | Freq: Once | INTRAMUSCULAR | Status: AC
  Administered 2024-02-26: 1000 ug via INTRAMUSCULAR

## 2024-02-26 NOTE — Progress Notes (Signed)
 Patient received B-12 injection in right arm. Patient tolerated well.

## 2024-03-28 ENCOUNTER — Ambulatory Visit

## 2024-04-08 ENCOUNTER — Ambulatory Visit: Payer: Medicare Other | Admitting: Family Medicine

## 2024-04-24 ENCOUNTER — Ambulatory Visit: Admitting: Family Medicine

## 2024-10-07 ENCOUNTER — Ambulatory Visit
